# Patient Record
Sex: Female | Born: 1980 | Race: Black or African American | Hispanic: No | Marital: Married | Smoking: Current some day smoker
Health system: Southern US, Community
[De-identification: ages and names within clinical notes are randomized; demographics above are authoritative.]

## PROBLEM LIST (undated history)

## (undated) DIAGNOSIS — N63 Unspecified lump in unspecified breast: Secondary | ICD-10-CM

## (undated) HISTORY — DX: Unspecified lump in unspecified breast: N63.0

---

## 2001-11-19 ENCOUNTER — Emergency Department (HOSPITAL_COMMUNITY): Admission: EM | Admit: 2001-11-19 | Discharge: 2001-11-19 | Payer: Self-pay | Admitting: *Deleted

## 2001-11-19 ENCOUNTER — Encounter: Payer: Self-pay | Admitting: *Deleted

## 2004-07-02 ENCOUNTER — Other Ambulatory Visit: Admission: RE | Admit: 2004-07-02 | Discharge: 2004-07-02 | Payer: Self-pay | Admitting: Gynecology

## 2005-11-29 ENCOUNTER — Other Ambulatory Visit: Admission: RE | Admit: 2005-11-29 | Discharge: 2005-11-29 | Payer: Self-pay | Admitting: Gynecology

## 2007-03-28 ENCOUNTER — Other Ambulatory Visit: Admission: RE | Admit: 2007-03-28 | Discharge: 2007-03-28 | Payer: Self-pay | Admitting: Gynecology

## 2007-06-10 ENCOUNTER — Emergency Department (HOSPITAL_COMMUNITY): Admission: EM | Admit: 2007-06-10 | Discharge: 2007-06-10 | Payer: Self-pay | Admitting: Emergency Medicine

## 2009-02-04 ENCOUNTER — Other Ambulatory Visit: Admission: RE | Admit: 2009-02-04 | Discharge: 2009-02-04 | Payer: Self-pay | Admitting: Gynecology

## 2009-02-04 ENCOUNTER — Ambulatory Visit: Payer: Self-pay | Admitting: Women's Health

## 2009-10-16 ENCOUNTER — Ambulatory Visit: Payer: Self-pay | Admitting: Women's Health

## 2010-03-22 ENCOUNTER — Other Ambulatory Visit (HOSPITAL_COMMUNITY)
Admission: RE | Admit: 2010-03-22 | Discharge: 2010-03-22 | Disposition: A | Discharge status: Home / Self Care | Payer: Managed Care, Other (non HMO) | Source: Ambulatory Visit | Attending: Gynecology | Admitting: Gynecology

## 2010-03-22 ENCOUNTER — Other Ambulatory Visit: Payer: Self-pay | Admitting: Women's Health

## 2010-03-22 ENCOUNTER — Encounter (INDEPENDENT_AMBULATORY_CARE_PROVIDER_SITE_OTHER): Payer: Managed Care, Other (non HMO) | Admitting: Women's Health

## 2010-03-22 DIAGNOSIS — Z01419 Encounter for gynecological examination (general) (routine) without abnormal findings: Secondary | ICD-10-CM

## 2010-03-22 DIAGNOSIS — Z124 Encounter for screening for malignant neoplasm of cervix: Secondary | ICD-10-CM | POA: Insufficient documentation

## 2010-03-22 DIAGNOSIS — Z13220 Encounter for screening for lipoid disorders: Secondary | ICD-10-CM

## 2010-03-22 DIAGNOSIS — R809 Proteinuria, unspecified: Secondary | ICD-10-CM

## 2010-05-19 ENCOUNTER — Inpatient Hospital Stay (INDEPENDENT_AMBULATORY_CARE_PROVIDER_SITE_OTHER)
Admission: RE | Admit: 2010-05-19 | Discharge: 2010-05-19 | Disposition: A | Discharge status: Home / Self Care | Payer: Managed Care, Other (non HMO) | Source: Ambulatory Visit | Attending: Family Medicine | Admitting: Family Medicine

## 2010-05-19 DIAGNOSIS — K089 Disorder of teeth and supporting structures, unspecified: Secondary | ICD-10-CM

## 2010-07-17 ENCOUNTER — Inpatient Hospital Stay (INDEPENDENT_AMBULATORY_CARE_PROVIDER_SITE_OTHER)
Admission: RE | Admit: 2010-07-17 | Discharge: 2010-07-17 | Disposition: A | Discharge status: Home / Self Care | Payer: Managed Care, Other (non HMO) | Source: Ambulatory Visit | Attending: Family Medicine | Admitting: Family Medicine

## 2010-07-17 DIAGNOSIS — R300 Dysuria: Secondary | ICD-10-CM

## 2010-07-17 DIAGNOSIS — N760 Acute vaginitis: Secondary | ICD-10-CM

## 2010-07-17 LAB — POCT URINALYSIS DIP (DEVICE)
Bilirubin Urine: NEGATIVE
Glucose, UA: NEGATIVE mg/dL
Ketones, ur: NEGATIVE mg/dL
Leukocytes, UA: NEGATIVE
Nitrite: POSITIVE — AB
Protein, ur: 30 mg/dL — AB
Specific Gravity, Urine: 1.02 (ref 1.005–1.030)
Urobilinogen, UA: 1 mg/dL (ref 0.0–1.0)
pH: 7 (ref 5.0–8.0)

## 2010-07-17 LAB — WET PREP, GENITAL
Clue Cells Wet Prep HPF POC: NONE SEEN
Yeast Wet Prep HPF POC: NONE SEEN

## 2010-07-17 LAB — POCT PREGNANCY, URINE: Preg Test, Ur: NEGATIVE

## 2010-07-19 LAB — URINE CULTURE
Colony Count: >=100000
Culture  Setup Time: 201207010132

## 2010-07-19 LAB — GC/CHLAMYDIA PROBE AMP, GENITAL: GC Probe Amp, Genital: NEGATIVE

## 2010-09-17 ENCOUNTER — Encounter: Payer: Self-pay | Admitting: Gynecology

## 2010-09-17 ENCOUNTER — Ambulatory Visit (INDEPENDENT_AMBULATORY_CARE_PROVIDER_SITE_OTHER): Payer: Managed Care, Other (non HMO) | Admitting: Gynecology

## 2010-09-17 DIAGNOSIS — B3731 Acute candidiasis of vulva and vagina: Secondary | ICD-10-CM

## 2010-09-17 DIAGNOSIS — N926 Irregular menstruation, unspecified: Secondary | ICD-10-CM

## 2010-09-17 DIAGNOSIS — B373 Candidiasis of vulva and vagina: Secondary | ICD-10-CM

## 2010-09-17 DIAGNOSIS — N898 Other specified noninflammatory disorders of vagina: Secondary | ICD-10-CM

## 2010-09-17 DIAGNOSIS — N63 Unspecified lump in unspecified breast: Secondary | ICD-10-CM

## 2010-09-17 MED ORDER — FLUCONAZOLE 150 MG PO TABS: 150.0000 mg | ORAL_TABLET | Freq: Once | ORAL | Status: AC

## 2010-09-17 NOTE — Patient Instructions (Signed)
Follow up for breast sono and mammogram that doctors office will schedule

## 2010-09-17 NOTE — Progress Notes (Signed)
Patient presents for several issues. She had been on oral contraceptives but stopped them notes her last period August 16 was a little lighter and she asked if she could check a pregnancy test. She did not restart birth control pills. Also notes 2 weeks worth of vaginal discharge no itching but just heavier discharge.  Lastly patient notes a nodule that she fell 2 weeks ago in her left breast has not reexamined herself notes it was a little bit tender.  Exam Breasts: Examined lying and sitting. Left without masses retractions discharge adenopathy. The area she's pointing to at 6:00 several fingerbreadths below the areola has no palpable abnormalities. Right with firm oval mass approximately 2 cm 9:00 at the areola. Freely mobile no overlying skin changes nipple discharge or axillary adenopathy. Pelvic: External BUS vagina with white discharge KOH wet prep done, cervix normal, uterus normal size midline mobile nontender, adnexa without masses or tenderness  Assessment and plan: #1 White discharge. KOH wet prep positive for yeast we'll treat with Diflucan 150x1 dose follow up if symptoms persist or recur. #2 Lighter last menstrual period. We'll check UPT at her request. Assuming negative we'll keep menstrual calendar as long as regular will follow if irregular will represent for evaluation and again the patient does not want contraception at this time. #3 Breast lump. Patient presents with questionable left breast lump and in fact her left breast is without any abnormalities. On her right breast she has a firm nodule at the 9 clock position off the areola. I attempted aspiration today alcohol cleanse 1% lidocaine infiltration with no return. The mass felt very firm and gritty to the needle. 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 I discussed all this with the patient the need for further evaluation and we will schedule diagnostic mammo-bilateral and ultrasound of the right breast. Various scenarios to include  observation, needle core biopsy, excisional biopsy were all reviewed she knows importance of followup and we'll further discuss once the mammogram and ultrasound reports are available. She knows if she does not hear from my office to schedule the studies in several days to call us.

## 2010-09-23 ENCOUNTER — Ambulatory Visit: Payer: Managed Care, Other (non HMO) | Admitting: Gynecology

## 2010-09-23 ENCOUNTER — Ambulatory Visit
Admission: RE | Admit: 2010-09-23 | Discharge: 2010-09-23 | Disposition: A | Discharge status: Home / Self Care | Payer: Managed Care, Other (non HMO) | Source: Ambulatory Visit | Attending: Gynecology | Admitting: Gynecology

## 2010-09-23 ENCOUNTER — Ambulatory Visit: Payer: Managed Care, Other (non HMO) | Admitting: Women's Health

## 2010-09-23 ENCOUNTER — Telehealth: Payer: Self-pay | Admitting: Gynecology

## 2010-09-23 DIAGNOSIS — N63 Unspecified lump in unspecified breast: Secondary | ICD-10-CM

## 2010-09-23 NOTE — Telephone Encounter (Signed)
Tehl patient I received mammogram and ultrasound report that were normal other than showing some cysts. I want to reexamine her breast and ask her to make an appointment to see me.

## 2010-09-23 NOTE — Telephone Encounter (Signed)
Pt informed with the below note, pt transferred to appointment desk to schedule.

## 2010-10-18 ENCOUNTER — Ambulatory Visit (INDEPENDENT_AMBULATORY_CARE_PROVIDER_SITE_OTHER): Payer: Managed Care, Other (non HMO) | Admitting: Gynecology

## 2010-10-18 ENCOUNTER — Encounter: Payer: Self-pay | Admitting: Gynecology

## 2010-10-18 DIAGNOSIS — N63 Unspecified lump in unspecified breast: Secondary | ICD-10-CM

## 2010-10-18 DIAGNOSIS — N898 Other specified noninflammatory disorders of vagina: Secondary | ICD-10-CM

## 2010-10-18 DIAGNOSIS — B373 Candidiasis of vulva and vagina: Secondary | ICD-10-CM

## 2010-10-18 MED ORDER — TERCONAZOLE 0.8 % VA CREA: 1.0000 | TOPICAL_CREAM | Freq: Every day | VAGINAL | Status: AC

## 2010-10-18 NOTE — Patient Instructions (Signed)
Follow up with general surgeon to examine breast

## 2010-10-18 NOTE — Progress Notes (Signed)
Patient presents for 2 issues. First is followup of her right breast mass and the second is persistence of her vaginal discharge and itching. She was treated with Diflucan for yeast and her symptoms have persisted. She had ultrasound mammogram and reexam by the radiologist. They did not see any mass on mammogram or ultrasound in the right breast area. On exam the radiologist, Dr. Chilton Si noted no palpable abnormalities.  Exam Breast: Examined lying and sitting. Left without masses retractions discharge adenopathy. Right was clearly palpable 2 cm firm nodule retroareolar at 9:00 mobile no overlying skin changes nipple discharge or axillary adenopathy Pelvic: External BUS vagina with thick white discharge KOH wet prep done  Assessment and plan: 1. Vaginal discharge. KOH wet prep positive for yeast we'll treat with Terazol 3 day cream follow up if symptoms persist or recur. 2. Breast mass. Ultrasound mammogram negative but I clearly still feel an area.   I made the patient examine herself also she felt the abnormality. Refer to general surgery, I suspect this is either a fibroadenoma or possibly just firm breast tissue but I want them involved. I discussed with her the various options to include observation, needle core biopsy, excisional biopsy. Patient knows the importance of followup and she knows if she does not hear from my office in several days to arrange the appointment and she will call us to make sure that it is done.

## 2010-11-01 ENCOUNTER — Ambulatory Visit (INDEPENDENT_AMBULATORY_CARE_PROVIDER_SITE_OTHER): Payer: Self-pay | Admitting: Surgery

## 2010-11-22 ENCOUNTER — Ambulatory Visit (INDEPENDENT_AMBULATORY_CARE_PROVIDER_SITE_OTHER): Payer: Managed Care, Other (non HMO) | Admitting: Surgery

## 2010-11-22 ENCOUNTER — Encounter (INDEPENDENT_AMBULATORY_CARE_PROVIDER_SITE_OTHER): Payer: Self-pay | Admitting: Surgery

## 2010-11-22 ENCOUNTER — Other Ambulatory Visit (INDEPENDENT_AMBULATORY_CARE_PROVIDER_SITE_OTHER): Payer: Self-pay | Admitting: Surgery

## 2010-11-22 VITALS — BP 126/96 | HR 60 | Temp 97.4°F | Resp 20 | Ht 66.0 in | Wt 139.2 lb

## 2010-11-22 DIAGNOSIS — N63 Unspecified lump in unspecified breast: Secondary | ICD-10-CM

## 2010-11-22 DIAGNOSIS — N6310 Unspecified lump in the right breast, unspecified quadrant: Secondary | ICD-10-CM | POA: Insufficient documentation

## 2010-11-22 NOTE — Progress Notes (Signed)
Chief Complaint  Patient presents with  . New Evaluation    eval of lump in right breast     HPI Julie Snyder is a 30 y.o. female.   HPIThis is a pleasant female referred by Dr. Audie Box for evaluation of a right breast mass. Dr. Audie Box felt a mass back in August. It persisted when he saw her back in October. She now can feel the mass. She denies any drainage from her nipples. She has no previous history of breast biopsy were breast mass. She denies breast pain.  Past Medical History  Diagnosis Date  . Breast lump october 2012    Past Surgical History  Procedure Date  . Mouth surgery 5/12    Family History  Problem Relation Age of Onset  . Hypertension Mother     Social History History  Substance Use Topics  . Smoking status: Never Smoker   . Smokeless tobacco: Never Used  . Alcohol Use: Yes    No Known Allergies  No current outpatient prescriptions on file.    Review of Systems Review of Systems  Constitutional: Negative for fever, chills and unexpected weight change.  HENT: Negative for hearing loss, congestion, sore throat, trouble swallowing and voice change.   Eyes: Negative for visual disturbance.  Respiratory: Negative for cough and wheezing.   Cardiovascular: Negative for chest pain, palpitations and leg swelling.  Gastrointestinal: Negative for nausea, vomiting, abdominal pain, diarrhea, constipation, blood in stool, abdominal distention and anal bleeding.  Genitourinary: Negative for hematuria, vaginal bleeding and difficulty urinating.  Musculoskeletal: Negative for arthralgias.  Skin: Negative for rash and wound.  Neurological: Negative for seizures, syncope and headaches.  Hematological: Negative for adenopathy. Does not bruise/bleed easily.  Psychiatric/Behavioral: Negative for confusion.    Blood pressure 126/96, pulse 60, temperature 97.4 F (36.3 C), temperature source Temporal, resp. rate 20, height 5\' 6"  (1.676 m), weight 139 lb 4 oz  (63.163 kg).  Physical Exam Physical Exam  Constitutional: She is oriented to person, place, and time. She appears well-developed and well-nourished. No distress.  HENT:  Head: Normocephalic and atraumatic.  Right Ear: External ear normal.  Left Ear: External ear normal.  Nose: Nose normal.  Mouth/Throat: Oropharynx is clear and moist. No oropharyngeal exudate.  Eyes: Conjunctivae and EOM are normal. Pupils are equal, round, and reactive to light. Right eye exhibits no discharge. Left eye exhibits no discharge. No scleral icterus.  Neck: Normal range of motion. Neck supple. No JVD present. No tracheal deviation present. No thyromegaly present.  Cardiovascular: Normal rate, regular rhythm, normal heart sounds and intact distal pulses.   No murmur heard. Pulmonary/Chest: Effort normal and breath sounds normal. No respiratory distress. She has no wheezes. She has no rales.  Abdominal: Soft. Bowel sounds are normal. She exhibits no distension. There is no tenderness. There is no rebound.  Musculoskeletal: Normal range of motion. She exhibits no edema and no tenderness.  Lymphadenopathy:    She has no cervical adenopathy.  Neurological: She is alert and oriented to person, place, and time.  Skin: Skin is warm and dry. No rash noted. No erythema.  Psychiatric: Her behavior is normal. Judgment normal.   Breasts are examined bilaterally. There is a smooth deep one and a half to 2 cm subareolar right breast mass that is almost at the 9:00 position. Both breasts are lumpy bumpy. There is no excellent adenopathy on either side. Data Reviewed  I have reviewed the mammogram and ultrasound. No abnormalities are identified on either study  Assessment    Right breast mass    Plan    We discussed options which include expected management versus surgical excision of this area. She wishes to proceed with surgical excision for histologic evaluation to rule out malignancy. I've discussed this with her  in detail including the risks of bleeding, infection, injury to surrounding structures, findings of malignancy with need for further surgery, etc. Likelihood of success is good. Surgery we scheduled.       Demetres Prochnow A 11/22/2010, 10:30 AM

## 2010-12-18 HISTORY — PX: BREAST SURGERY: SHX581

## 2010-12-20 ENCOUNTER — Encounter: Payer: Self-pay | Admitting: Women's Health

## 2010-12-20 ENCOUNTER — Ambulatory Visit (INDEPENDENT_AMBULATORY_CARE_PROVIDER_SITE_OTHER): Payer: Managed Care, Other (non HMO) | Admitting: Women's Health

## 2010-12-20 DIAGNOSIS — N898 Other specified noninflammatory disorders of vagina: Secondary | ICD-10-CM

## 2010-12-20 DIAGNOSIS — N912 Amenorrhea, unspecified: Secondary | ICD-10-CM

## 2010-12-20 MED ORDER — FLUCONAZOLE 100 MG PO TABS: 100.0000 mg | ORAL_TABLET | Freq: Every day | ORAL | Status: DC

## 2010-12-20 NOTE — Progress Notes (Signed)
Patient ID: Julie Snyder, female   DOB: 1980/11/08, 30 y.o.   MRN: [id number] Presents with a complaint of discharge not resolved by over-the-counter Monistat. Was treated for trichomoniasis at an urgent care over the summer, negative STD screen. One partner since, did not use condoms and currently not active for several months. Denies any UTI symptoms, fever, or abdominal pain. Had stopped Loestrin 1/20 since not sexually active. LMP  10-31-10. UPT negative.  Exam: Abdomen soft nontender, external genitalia is within normal limits, speculum exam scant white discharge vaginal walls are slightly erythematous. Bimanual no CMT or adnexal fullness or tenderness. GC/Chlamydia culture taken and is pending.  Wet prep positive for yeast  Plan: Diflucan 100 by mouth daily for 5 days, then weekly for 3 weeks then monthly with cycle. Start back on Loestrin 1/20 1st day of next cycle. Instructed to call or return to office if no cycle by end of December. Did review  importance of condoms. Will check HIV, hepatitis and RPR at annual exam.

## 2010-12-29 ENCOUNTER — Encounter (HOSPITAL_COMMUNITY): Payer: Self-pay | Admitting: Pharmacy Technician

## 2010-12-31 ENCOUNTER — Encounter (HOSPITAL_COMMUNITY): Payer: Self-pay

## 2010-12-31 ENCOUNTER — Encounter (HOSPITAL_COMMUNITY)
Admission: RE | Admit: 2010-12-31 | Discharge: 2010-12-31 | Disposition: A | Discharge status: Home / Self Care | Payer: Managed Care, Other (non HMO) | Source: Ambulatory Visit | Attending: Surgery | Admitting: Surgery

## 2010-12-31 HISTORY — PX: MOUTH SURGERY: SHX715

## 2010-12-31 LAB — HCG, SERUM, QUALITATIVE: Preg, Serum: NEGATIVE

## 2010-12-31 NOTE — Patient Instructions (Signed)
[address] Ala Kratz  12/31/2010   Your procedure is scheduled on: 01-05-11  Report to [facility] at 0745 AM.  Call this number if you have problems the morning of surgery: [phone]   Remember:   Do not eat food:After Midnight.  May have clear liquids:until Midnight .  Clear liquids include soda, tea, black coffee, apple or grape juice, broth.  Take these medicines the morning of surgery with A SIP OF WATER:none   Do not wear jewelry, make-up or nail polish.  Do not wear lotions, powders, or perfumes. You may wear deodorant.  Do not shave 48 hours prior to surgery.  Do not bring valuables to the hospital.  Contacts, dentures or bridgework may not be worn into surgery.  Leave suitcase in the car. After surgery it may be brought to your room.  For patients admitted to the hospital, checkout time is 11:00 AM the day of discharge.   Patients discharged the day of surgery will not be allowed to drive home.  Name and phone number of your driver: Serena Croissant  Special Instructions: CHG Shower Use Special Wash: 1/2 bottle night before surgery and 1/2 bottle morning of surgery.             Do not shave body hair x2 days prior   Please read over the following fact sheets that you were given: MRSA Information

## 2011-01-05 ENCOUNTER — Ambulatory Visit (HOSPITAL_COMMUNITY)
Admission: RE | Admit: 2011-01-05 | Discharge: 2011-01-05 | Disposition: A | Discharge status: Home / Self Care | Payer: Managed Care, Other (non HMO) | Source: Ambulatory Visit | Attending: Surgery | Admitting: Surgery

## 2011-01-05 ENCOUNTER — Encounter (HOSPITAL_COMMUNITY): Payer: Self-pay | Admitting: Anesthesiology

## 2011-01-05 ENCOUNTER — Ambulatory Visit (HOSPITAL_COMMUNITY): Payer: Managed Care, Other (non HMO) | Admitting: Anesthesiology

## 2011-01-05 ENCOUNTER — Encounter (HOSPITAL_COMMUNITY): Payer: Self-pay

## 2011-01-05 ENCOUNTER — Other Ambulatory Visit (INDEPENDENT_AMBULATORY_CARE_PROVIDER_SITE_OTHER): Payer: Self-pay | Admitting: Surgery

## 2011-01-05 ENCOUNTER — Encounter (HOSPITAL_COMMUNITY)
Admission: RE | Disposition: A | Discharge status: Home / Self Care | Payer: Self-pay | Source: Ambulatory Visit | Attending: Surgery

## 2011-01-05 DIAGNOSIS — Z01812 Encounter for preprocedural laboratory examination: Secondary | ICD-10-CM | POA: Insufficient documentation

## 2011-01-05 DIAGNOSIS — N6019 Diffuse cystic mastopathy of unspecified breast: Secondary | ICD-10-CM

## 2011-01-05 DIAGNOSIS — N6039 Fibrosclerosis of unspecified breast: Secondary | ICD-10-CM | POA: Insufficient documentation

## 2011-01-05 HISTORY — PX: BREAST LUMPECTOMY: SHX2

## 2011-01-05 SURGERY — BREAST LUMPECTOMY
Anesthesia: General | Site: Breast | Laterality: Right | Wound class: Clean

## 2011-01-05 MED ORDER — SODIUM CHLORIDE 0.9 % IJ SOLN: 3.0000 mL | INTRAMUSCULAR | Status: DC | PRN

## 2011-01-05 MED ORDER — ACETAMINOPHEN 10 MG/ML IV SOLN
INTRAVENOUS | Status: AC
  Filled 2011-01-05: qty 100

## 2011-01-05 MED ORDER — CEFAZOLIN SODIUM 1-5 GM-% IV SOLN
INTRAVENOUS | Status: DC | PRN
  Administered 2011-01-05: 1 g via INTRAVENOUS

## 2011-01-05 MED ORDER — ONDANSETRON HCL 4 MG/2ML IJ SOLN: 4.0000 mg | Freq: Four times a day (QID) | INTRAMUSCULAR | Status: DC | PRN

## 2011-01-05 MED ORDER — ACETAMINOPHEN 10 MG/ML IV SOLN
INTRAVENOUS | Status: DC | PRN
  Administered 2011-01-05: 1000 mg via INTRAVENOUS

## 2011-01-05 MED ORDER — BUPIVACAINE HCL (PF) 0.5 % IJ SOLN
INTRAMUSCULAR | Status: AC
  Filled 2011-01-05: qty 30

## 2011-01-05 MED ORDER — PROMETHAZINE HCL 25 MG/ML IJ SOLN: 6.2500 mg | INTRAMUSCULAR | Status: DC | PRN

## 2011-01-05 MED ORDER — CEFAZOLIN SODIUM 1-5 GM-% IV SOLN: 1.0000 g | INTRAVENOUS | Status: DC

## 2011-01-05 MED ORDER — SODIUM CHLORIDE 0.9 % IV SOLN: 250.0000 mL | INTRAVENOUS | Status: DC | PRN

## 2011-01-05 MED ORDER — CEFAZOLIN SODIUM 1-5 GM-% IV SOLN
INTRAVENOUS | Status: AC
  Filled 2011-01-05: qty 50

## 2011-01-05 MED ORDER — SODIUM CHLORIDE 0.9 % IJ SOLN: 3.0000 mL | Freq: Two times a day (BID) | INTRAMUSCULAR | Status: DC

## 2011-01-05 MED ORDER — FENTANYL CITRATE 0.05 MG/ML IJ SOLN: 25.0000 ug | INTRAMUSCULAR | Status: DC | PRN

## 2011-01-05 MED ORDER — PROPOFOL 10 MG/ML IV BOLUS
INTRAVENOUS | Status: DC | PRN
  Administered 2011-01-05: 180 mg via INTRAVENOUS

## 2011-01-05 MED ORDER — OXYCODONE HCL 5 MG PO TABS: 5.0000 mg | ORAL_TABLET | ORAL | Status: DC | PRN

## 2011-01-05 MED ORDER — MORPHINE SULFATE 10 MG/ML IJ SOLN: 2.0000 mg | INTRAMUSCULAR | Status: DC | PRN

## 2011-01-05 MED ORDER — PROMETHAZINE HCL 25 MG/ML IJ SOLN: 12.5000 mg | Freq: Four times a day (QID) | INTRAMUSCULAR | Status: DC | PRN

## 2011-01-05 MED ORDER — ACETAMINOPHEN 650 MG RE SUPP
650.0000 mg | RECTAL | Status: DC | PRN
  Filled 2011-01-05: qty 1

## 2011-01-05 MED ORDER — LACTATED RINGERS IV SOLN
INTRAVENOUS | Status: DC
  Administered 2011-01-05: 1000 mL via INTRAVENOUS

## 2011-01-05 MED ORDER — HYDROCODONE-ACETAMINOPHEN 5-325 MG PO TABS: 1.0000 | ORAL_TABLET | ORAL | Status: AC | PRN

## 2011-01-05 MED ORDER — ACETAMINOPHEN 325 MG PO TABS: 650.0000 mg | ORAL_TABLET | ORAL | Status: DC | PRN

## 2011-01-05 MED ORDER — FENTANYL CITRATE 0.05 MG/ML IJ SOLN
INTRAMUSCULAR | Status: DC | PRN
  Administered 2011-01-05 (×2): 50 ug via INTRAVENOUS

## 2011-01-05 MED ORDER — LIDOCAINE-EPINEPHRINE (PF) 1 %-1:200000 IJ SOLN
INTRAMUSCULAR | Status: AC
  Filled 2011-01-05: qty 10

## 2011-01-05 MED ORDER — 0.9 % SODIUM CHLORIDE (POUR BTL) OPTIME
TOPICAL | Status: DC | PRN
  Administered 2011-01-05: 1000 mL

## 2011-01-05 MED ORDER — MIDAZOLAM HCL 5 MG/5ML IJ SOLN
INTRAMUSCULAR | Status: DC | PRN
  Administered 2011-01-05: 2 mg via INTRAVENOUS

## 2011-01-05 MED ORDER — LIDOCAINE HCL (CARDIAC) 20 MG/ML IV SOLN
INTRAVENOUS | Status: DC | PRN
  Administered 2011-01-05: 80 mg via INTRAVENOUS

## 2011-01-05 MED ORDER — BUPIVACAINE HCL (PF) 0.5 % IJ SOLN
INTRAMUSCULAR | Status: DC | PRN
  Administered 2011-01-05: 18 mL

## 2011-01-05 MED ORDER — ONDANSETRON HCL 4 MG/2ML IJ SOLN
INTRAMUSCULAR | Status: DC | PRN
  Administered 2011-01-05: 4 mg via INTRAVENOUS

## 2011-01-05 SURGICAL SUPPLY — 35 items
ADH SKN CLS APL DERMABOND .7 (GAUZE/BANDAGES/DRESSINGS) ×1
APL SKNCLS STERI-STRIP NONHPOA (GAUZE/BANDAGES/DRESSINGS) ×1
APPLIER CLIP 9.375 MED OPEN (MISCELLANEOUS)
APR CLP MED 9.3 20 MLT OPN (MISCELLANEOUS)
BENZOIN TINCTURE PRP APPL 2/3 (GAUZE/BANDAGES/DRESSINGS) ×1 IMPLANT
CANISTER SUCTION 2500CC (MISCELLANEOUS) ×2 IMPLANT
CHLORAPREP W/TINT 26ML (MISCELLANEOUS) ×2 IMPLANT
CLIP APPLIE 9.375 MED OPEN (MISCELLANEOUS) IMPLANT
CLOSURE STERI STRIP 1/2 X4 (GAUZE/BANDAGES/DRESSINGS) ×1 IMPLANT
CLOTH BEACON ORANGE TIMEOUT ST (SAFETY) ×2 IMPLANT
COVER PROBE W GEL 5X96 (DRAPES) IMPLANT
COVER SURGICAL LIGHT HANDLE (MISCELLANEOUS) ×2 IMPLANT
DECANTER SPIKE VIAL GLASS SM (MISCELLANEOUS) ×2 IMPLANT
DERMABOND ADVANCED (GAUZE/BANDAGES/DRESSINGS) ×1
DERMABOND ADVANCED .7 DNX12 (GAUZE/BANDAGES/DRESSINGS) ×1 IMPLANT
ELECT CAUTERY BLADE 6.4 (BLADE) ×2 IMPLANT
ELECT REM PT RETURN 9FT ADLT (ELECTROSURGICAL) ×2
ELECTRODE REM PT RTRN 9FT ADLT (ELECTROSURGICAL) ×1 IMPLANT
GLOVE SURG SIGNA 7.5 PF LTX (GLOVE) ×2 IMPLANT
GOWN PREVENTION PLUS XLARGE (GOWN DISPOSABLE) ×2 IMPLANT
GOWN STRL NON-REIN LRG LVL3 (GOWN DISPOSABLE) ×2 IMPLANT
KIT BASIN OR (CUSTOM PROCEDURE TRAY) ×2 IMPLANT
KIT MARKER MARGIN INK (KITS) IMPLANT
NS IRRIG 1000ML POUR BTL (IV SOLUTION) ×2 IMPLANT
PACK GENERAL/GYN (CUSTOM PROCEDURE TRAY) ×2 IMPLANT
SPONGE GAUZE 4X4 12PLY (GAUZE/BANDAGES/DRESSINGS) ×1 IMPLANT
SPONGE LAP 4X18 X RAY DECT (DISPOSABLE) ×2 IMPLANT
STAPLER VISISTAT 35W (STAPLE) ×2 IMPLANT
SUT MNCRL AB 4-0 PS2 18 (SUTURE) ×2 IMPLANT
SUT SILK 2 0 SH (SUTURE) IMPLANT
SUT VIC AB 3-0 SH 18 (SUTURE) ×2 IMPLANT
SYR CONTROL 10ML LL (SYRINGE) ×2 IMPLANT
TAPE HYPAFIX 4 X10 (GAUZE/BANDAGES/DRESSINGS) ×1 IMPLANT
TOWEL OR 17X26 10 PK STRL BLUE (TOWEL DISPOSABLE) ×2 IMPLANT
WATER STERILE IRR 1000ML POUR (IV SOLUTION) IMPLANT

## 2011-01-05 NOTE — Anesthesia Preprocedure Evaluation (Signed)
Anesthesia Evaluation  Patient identified by MRN, date of birth, ID band Patient awake    Reviewed: Allergy & Precautions, H&P , NPO status , Patient's Chart, lab work & pertinent test results  Airway Mallampati: II TM Distance: >3 FB Neck ROM: Full    Dental No notable dental hx.    Pulmonary neg pulmonary ROS,  clear to auscultation  Pulmonary exam normal       Cardiovascular neg cardio ROS Regular Normal    Neuro/Psych Negative Neurological ROS  Negative Psych ROS   GI/Hepatic negative GI ROS, Neg liver ROS,   Endo/Other  Negative Endocrine ROS  Renal/GU negative Renal ROS  Genitourinary negative   Musculoskeletal negative musculoskeletal ROS (+)   Abdominal   Peds negative pediatric ROS (+)  Hematology negative hematology ROS (+)   Anesthesia Other Findings   Reproductive/Obstetrics negative OB ROS                           Anesthesia Physical Anesthesia Plan  ASA: II  Anesthesia Plan: General   Post-op Pain Management:    Induction: Intravenous  Airway Management Planned: LMA  Additional Equipment:   Intra-op Plan:   Post-operative Plan: Extubation in OR  Informed Consent: I have reviewed the patients History and Physical, chart, labs and discussed the procedure including the risks, benefits and alternatives for the proposed anesthesia with the patient or authorized representative who has indicated his/her understanding and acceptance.   Dental advisory given  Plan Discussed with: CRNA  Anesthesia Plan Comments:        Anesthesia Quick Evaluation  

## 2011-01-05 NOTE — Transfer of Care (Signed)
Immediate Anesthesia Transfer of Care Note  Patient: Julie Snyder  Procedure(s) Performed:  LUMPECTOMY  Patient Location: PACU  Anesthesia Type: General  Level of Consciousness: awake  Airway & Oxygen Therapy: Patient Spontanous Breathing and Patient connected to face mask oxygen  Post-op Assessment: Report given to PACU RN and Post -op Vital signs reviewed and stable  Post vital signs: Reviewed and stable  Complications: No apparent anesthesia complications

## 2011-01-05 NOTE — Interval H&P Note (Signed)
History and Physical Interval Note:  Since I saw her last, she has had no change in her history or exam of the right breast mass  01/05/2011 6:28 AM  Wyn Quaker  has presented today for surgery, with the diagnosis of Right breast mass  The various methods of treatment have been discussed with the patient and family. After consideration of risks, benefits and other options for treatment, the patient has consented to  Procedure(s): LUMPECTOMY as a surgical intervention .  The patients' history has been reviewed, patient examined, no change in status, stable for surgery.  I have reviewed the patients' chart and labs.  Questions were answered to the patient's satisfaction.     Brittnee Gaetano A

## 2011-01-05 NOTE — Anesthesia Postprocedure Evaluation (Signed)
  Anesthesia Post-op Note  Patient: Julie Snyder  Procedure(s) Performed:  LUMPECTOMY  Patient Location: PACU  Anesthesia Type: General  Level of Consciousness: awake and alert   Airway and Oxygen Therapy: Patient Spontanous Breathing  Post-op Pain: mild  Post-op Assessment: Post-op Vital signs reviewed, Patient's Cardiovascular Status Stable, Respiratory Function Stable, Patent Airway and No signs of Nausea or vomiting  Post-op Vital Signs: stable  Complications: No apparent anesthesia complications

## 2011-01-05 NOTE — Op Note (Signed)
01/05/2011  10:09 AM  PATIENT:  Julie Snyder  30 y.o. female  PRE-OPERATIVE DIAGNOSIS:  Right breast mass  POST-OPERATIVE DIAGNOSIS:  right breast mass'   PROCEDURE:  Procedure(s): Excision of Right Breast Mass  SURGEON:  Surgeon(s): Shelly Rubenstein, MD  PHYSICIAN ASSISTANT:   ASSISTANTS: none   ANESTHESIA:   local and general  EBL:     BLOOD ADMINISTERED:none  DRAINS: none   LOCAL MEDICATIONS USED:  MARCAINE 18CC  SPECIMEN:  Excision right breast mass  DISPOSITION OF SPECIMEN:  PATHOLOGY  COUNTS:  YES  TOURNIQUET:  * No tourniquets in log *  DICTATION: .Dragon Dictation   PLAN OF CARE: Discharge to home after PACU  PATIENT DISPOSITION:  PACU - hemodynamically stable.   Indications: This is a young female with a palpable right breast mass. The decision had been made to proceed with removal of the mass.  Findings: The mass appeared consistent with a cyst. It was sent to pathology for evaluation  Procedure: The patient was brought to the operating room and identified as the correct patient. She was placed supine on the operating room table and general anesthesia was induced. Her right breast was prepped and draped in the usual sterile fashion. I anesthetized the skin over the palpable mass at the lateral edge of the areola with Marcaine. I then made a circumareolar incision with a scalpel. I took this down to the breast mass with electric cautery. As the mass is identified it leaked fluid consistent with a benign cyst. I excised the mass in its entirety with electrocautery and sent the mass to pathology for evaluation. I then irrigated with saline. Hemostasis was achieved with cautery. I palpated no further masses. I then closed the subcutaneous tissue with interrupted 3-0 Vicryl sutures and closed the skin with a running 4-0 Monocryl. Steri-Strips and bandage were applied. The patient tolerated the procedure well. All the counts were correct at the end of the  procedure. The patient was then extubated in the operating room and taken in stable condition to the recovery room.

## 2011-01-05 NOTE — H&P (Signed)
HPI  Julie Snyder is a 30 y.o. female.  HPIThis is a pleasant female referred by Dr. Audie Box for evaluation of a right breast mass. Dr. Audie Box felt a mass back in August. It persisted when he saw her back in October. She now can feel the mass. She denies any drainage from her nipples. She has no previous history of breast biopsy were breast mass. She denies breast pain.  Past Medical History   Diagnosis  Date   .  Breast lump  october 2012    Past Surgical History   Procedure  Date   .  Mouth surgery  5/12    Family History   Problem  Relation  Age of Onset   .  Hypertension  Mother     Social History  History   Substance Use Topics   .  Smoking status:  Never Smoker   .  Smokeless tobacco:  Never Used   .  Alcohol Use:  Yes    No Known Allergies  No current outpatient prescriptions on file.    Review of Systems  Review of Systems  Constitutional: Negative for fever, chills and unexpected weight change.  HENT: Negative for hearing loss, congestion, sore throat, trouble swallowing and voice change.  Eyes: Negative for visual disturbance.  Respiratory: Negative for cough and wheezing.  Cardiovascular: Negative for chest pain, palpitations and leg swelling.  Gastrointestinal: Negative for nausea, vomiting, abdominal pain, diarrhea, constipation, blood in stool, abdominal distention and anal bleeding.  Genitourinary: Negative for hematuria, vaginal bleeding and difficulty urinating.  Musculoskeletal: Negative for arthralgias.  Skin: Negative for rash and wound.  Neurological: Negative for seizures, syncope and headaches.  Hematological: Negative for adenopathy. Does not bruise/bleed easily.  Psychiatric/Behavioral: Negative for confusion.   Blood pressure 126/96, pulse 60, temperature 97.4 F (36.3 C), temperature source Temporal, resp. rate 20, height 5\' 6"  (1.676 m), weight 139 lb 4 oz (63.163 kg).  Physical Exam  Physical Exam  Constitutional: She is oriented to  person, place, and time. She appears well-developed and well-nourished. No distress.  HENT:  Head: Normocephalic and atraumatic.  Right Ear: External ear normal.  Left Ear: External ear normal.  Nose: Nose normal.  Mouth/Throat: Oropharynx is clear and moist. No oropharyngeal exudate.  Eyes: Conjunctivae and EOM are normal. Pupils are equal, round, and reactive to light. Right eye exhibits no discharge. Left eye exhibits no discharge. No scleral icterus.  Neck: Normal range of motion. Neck supple. No JVD present. No tracheal deviation present. No thyromegaly present.  Cardiovascular: Normal rate, regular rhythm, normal heart sounds and intact distal pulses.  No murmur heard.  Pulmonary/Chest: Effort normal and breath sounds normal. No respiratory distress. She has no wheezes. She has no rales.  Abdominal: Soft. Bowel sounds are normal. She exhibits no distension. There is no tenderness. There is no rebound.  Musculoskeletal: Normal range of motion. She exhibits no edema and no tenderness.  Lymphadenopathy:  She has no cervical adenopathy.  Neurological: She is alert and oriented to person, place, and time.  Skin: Skin is warm and dry. No rash noted. No erythema.  Psychiatric: Her behavior is normal. Judgment normal.  Breasts are examined bilaterally. There is a smooth deep one and a half to 2 cm subareolar right breast mass that is almost at the 9:00 position. Both breasts are lumpy bumpy. There is no excellent adenopathy on either side.    Data Reviewed  I have reviewed the mammogram and ultrasound. No abnormalities are identified on  either study  Assessment   Right breast mass   Plan   We discussed options which include expected management versus surgical excision of this area. She wishes to proceed with surgical excision for histologic evaluation to rule out malignancy. I've discussed this with her in detail including the risks of bleeding, infection, injury to surrounding  structures, findings of malignancy with need for further surgery, etc. Likelihood of success is good. Surgery we scheduled.   Stephanos Fan A

## 2011-01-06 ENCOUNTER — Telehealth (INDEPENDENT_AMBULATORY_CARE_PROVIDER_SITE_OTHER): Payer: Self-pay | Admitting: General Surgery

## 2011-01-06 NOTE — Telephone Encounter (Signed)
Pt called today at 4:45pm stating that she passed out in the bathroom and hit her head to the toilet last night 01-05-2011. And she also stated that she was two hours away and her head hurt. I told the pt to go to the ER and get a CT of her head

## 2011-01-07 ENCOUNTER — Telehealth (INDEPENDENT_AMBULATORY_CARE_PROVIDER_SITE_OTHER): Payer: Self-pay

## 2011-01-07 ENCOUNTER — Encounter (HOSPITAL_COMMUNITY): Payer: Self-pay | Admitting: Surgery

## 2011-01-07 NOTE — Telephone Encounter (Signed)
Pt calling in after doing dressing changes on pt's breast bx b/c she noticed an opening at the incision. I advised pt that as long as there is no redness,drainage,fever,or chills going with the br bx than she should just keep doing the dressing changes as normal. Pt adv. To call if any changes./ [facility]

## 2011-01-10 ENCOUNTER — Encounter (INDEPENDENT_AMBULATORY_CARE_PROVIDER_SITE_OTHER): Payer: Self-pay | Admitting: Surgery

## 2011-01-10 ENCOUNTER — Ambulatory Visit (INDEPENDENT_AMBULATORY_CARE_PROVIDER_SITE_OTHER): Payer: Managed Care, Other (non HMO) | Admitting: Surgery

## 2011-01-10 VITALS — BP 126/88 | HR 92 | Temp 96.8°F | Resp 16 | Ht 66.0 in | Wt 142.8 lb

## 2011-01-10 DIAGNOSIS — Z09 Encounter for follow-up examination after completed treatment for conditions other than malignant neoplasm: Secondary | ICD-10-CM

## 2011-01-10 NOTE — Progress Notes (Signed)
Subjective:     Patient ID: Julie Snyder, female   DOB: September 15, 1980, 30 y.o.   MRN: [id number]  HPI  She is here for a postoperative visit. She is status post a lumpectomy from last week. She was having some breakdown of the incision Review of Systems     Objective:   Physical Exam On exam, a small open area has developed. I pulled this together with Steri-Strips. There was no evidence of infection.  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 She will leave the Steri-Strips will next week. I will see her back in 2 weeks

## 2011-01-17 ENCOUNTER — Telehealth: Payer: Self-pay | Admitting: *Deleted

## 2011-01-17 NOTE — Telephone Encounter (Signed)
Telephone call, states cycle started with light bleeding and had 4-5 heavier days, now it has been light since. 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 Did have a negative blood pregnancy test at the time of the surgery, has not been sexually active since August. Stopped birth control pills since not active. Had had irregular cycles in her early 26s that resolved with birth control use. Will watch bleeding at this time, if bleeding does not stop by the end of the week will call back. Did review starting birth control pills first day of her next cycle for cycle control.

## 2011-01-17 NOTE — Telephone Encounter (Signed)
Pt calling wanting to let you know that she has been bleeding now for 21 days. Her LMP before this one was 10/31/10. Pt has not yet started her Loestrin 1/20 pills as directed once period starts. Pt wants to know if this is normal for her? Please advise

## 2011-01-19 ENCOUNTER — Encounter (INDEPENDENT_AMBULATORY_CARE_PROVIDER_SITE_OTHER): Payer: Self-pay | Admitting: Surgery

## 2011-01-19 ENCOUNTER — Ambulatory Visit (INDEPENDENT_AMBULATORY_CARE_PROVIDER_SITE_OTHER): Payer: Managed Care, Other (non HMO) | Admitting: Surgery

## 2011-01-19 VITALS — BP 126/82 | HR 68 | Temp 98.3°F | Resp 18 | Ht 65.0 in | Wt 140.0 lb

## 2011-01-19 DIAGNOSIS — Z09 Encounter for follow-up examination after completed treatment for conditions other than malignant neoplasm: Secondary | ICD-10-CM

## 2011-01-19 NOTE — Progress Notes (Signed)
Subjective:     Patient ID: Julie Snyder, female   DOB: July 05, 1980, 31 y.o.   MRN: [id number]  HPI She reports the wound has opened up and she is having drainage and nipple sensitivity. She denies fevers  Review of Systems     Objective:   Physical Exam On exam, the wound has opened up and there is a draining slight hematoma    Assessment:     Patient status post excision of breast mass with postop wound seroma/hematoma and now a draining open wound    Plan:     I will start her on hydrogel wound gel daily. I will see her back next week

## 2011-01-25 ENCOUNTER — Encounter (INDEPENDENT_AMBULATORY_CARE_PROVIDER_SITE_OTHER): Payer: Self-pay | Admitting: Surgery

## 2011-01-25 ENCOUNTER — Ambulatory Visit (INDEPENDENT_AMBULATORY_CARE_PROVIDER_SITE_OTHER): Payer: Managed Care, Other (non HMO) | Admitting: Surgery

## 2011-01-25 VITALS — BP 124/82 | HR 68 | Temp 97.8°F | Resp 16 | Ht 65.0 in | Wt 140.0 lb

## 2011-01-25 DIAGNOSIS — Z09 Encounter for follow-up examination after completed treatment for conditions other than malignant neoplasm: Secondary | ICD-10-CM

## 2011-01-25 NOTE — Progress Notes (Signed)
Subjective:     Patient ID: Julie Snyder, female   DOB: January 28, 1980, 31 y.o.   MRN: [id number]  HPI She is here for another visit. She is doing hydrogel daily to the breast wound  Review of Systems     Objective:   Physical Exam On exam, the wound is cleaner today. I treated the granulation tissue with silver nitrate. There is no evidence of infection    Assessment:     Patient status post excision of right breast mass with postop wound breakdown    Plan:     We will continue to hydrogel dressing changes and I will see her back in 2 weeks

## 2011-02-09 ENCOUNTER — Ambulatory Visit (INDEPENDENT_AMBULATORY_CARE_PROVIDER_SITE_OTHER): Payer: Managed Care, Other (non HMO) | Admitting: Surgery

## 2011-02-09 ENCOUNTER — Encounter (INDEPENDENT_AMBULATORY_CARE_PROVIDER_SITE_OTHER): Payer: Self-pay | Admitting: Surgery

## 2011-02-09 VITALS — BP 124/77 | HR 80 | Temp 98.6°F | Resp 18 | Ht 65.0 in | Wt 141.6 lb

## 2011-02-09 DIAGNOSIS — Z09 Encounter for follow-up examination after completed treatment for conditions other than malignant neoplasm: Secondary | ICD-10-CM

## 2011-02-09 NOTE — Progress Notes (Signed)
Subjective:     Patient ID: Julie Snyder, female   DOB: 02/10/1980, 32 y.o.   MRN: [id number]  HPI She is here for another wound check of the right breast. She has no complaints. She reports it is no longer draining  Review of Systems     Objective:   Physical Exam On exam, the incision has contracted. There is a small scab in minimal swelling with no evidence of infection    Assessment:     Patient with right breast wound status post lumpectomy    Plan:     She will continue her local wound care. He is doing quite well so I will see her back in 3 months. She will start using scar cream

## 2011-03-20 ENCOUNTER — Other Ambulatory Visit: Payer: Self-pay | Admitting: Women's Health

## 2011-03-31 ENCOUNTER — Other Ambulatory Visit (HOSPITAL_COMMUNITY)
Admission: RE | Admit: 2011-03-31 | Discharge: 2011-03-31 | Disposition: A | Discharge status: Home / Self Care | Payer: Managed Care, Other (non HMO) | Source: Ambulatory Visit | Attending: Obstetrics and Gynecology | Admitting: Obstetrics and Gynecology

## 2011-03-31 ENCOUNTER — Ambulatory Visit (INDEPENDENT_AMBULATORY_CARE_PROVIDER_SITE_OTHER): Payer: Managed Care, Other (non HMO) | Admitting: Women's Health

## 2011-03-31 ENCOUNTER — Encounter: Payer: Self-pay | Admitting: Women's Health

## 2011-03-31 ENCOUNTER — Telehealth: Payer: Self-pay | Admitting: *Deleted

## 2011-03-31 VITALS — BP 120/78 | Ht 64.5 in | Wt 148.0 lb

## 2011-03-31 DIAGNOSIS — Z01419 Encounter for gynecological examination (general) (routine) without abnormal findings: Secondary | ICD-10-CM

## 2011-03-31 DIAGNOSIS — E079 Disorder of thyroid, unspecified: Secondary | ICD-10-CM

## 2011-03-31 DIAGNOSIS — Z309 Encounter for contraceptive management, unspecified: Secondary | ICD-10-CM

## 2011-03-31 DIAGNOSIS — Z113 Encounter for screening for infections with a predominantly sexual mode of transmission: Secondary | ICD-10-CM

## 2011-03-31 DIAGNOSIS — N898 Other specified noninflammatory disorders of vagina: Secondary | ICD-10-CM

## 2011-03-31 DIAGNOSIS — IMO0001 Reserved for inherently not codable concepts without codable children: Secondary | ICD-10-CM

## 2011-03-31 LAB — CBC WITH DIFFERENTIAL/PLATELET
Eosinophils Absolute: 0.1 10*3/uL (ref 0.0–0.7)
Eosinophils Relative: 1 % (ref 0–5)
HCT: 33.4 % — ABNORMAL LOW (ref 36.0–46.0)
Lymphs Abs: 1.4 10*3/uL (ref 0.7–4.0)
MCH: 26.8 pg (ref 26.0–34.0)
MCV: 78.4 fL (ref 78.0–100.0)
Monocytes Absolute: 0.5 10*3/uL (ref 0.1–1.0)
Platelets: 326 10*3/uL (ref 150–400)
RBC: 4.26 MIL/uL (ref 3.87–5.11)

## 2011-03-31 LAB — TSH: TSH: 0.357 u[IU]/mL (ref 0.350–4.500)

## 2011-03-31 MED ORDER — NORGESTIM-ETH ESTRAD TRIPHASIC 0.18/0.215/0.25 MG-35 MCG PO TABS: 1.0000 | ORAL_TABLET | Freq: Every day | ORAL | Status: DC

## 2011-03-31 MED ORDER — FLUCONAZOLE 100 MG PO TABS: ORAL_TABLET | ORAL | Status: DC

## 2011-03-31 NOTE — Telephone Encounter (Signed)
Had to change diflucan rx to 150mg  because 100mg  not covered by insurance.
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
## 2011-03-31 NOTE — Patient Instructions (Signed)

## 2011-04-01 LAB — HEPATITIS C ANTIBODY: HCV Ab: NEGATIVE

## 2011-04-01 LAB — GC/CHLAMYDIA PROBE AMP, GENITAL
Chlamydia, DNA Probe: NEGATIVE
GC Probe Amp, Genital: NEGATIVE

## 2011-04-26 ENCOUNTER — Ambulatory Visit (INDEPENDENT_AMBULATORY_CARE_PROVIDER_SITE_OTHER): Payer: Managed Care, Other (non HMO) | Admitting: Surgery

## 2011-04-26 ENCOUNTER — Encounter (INDEPENDENT_AMBULATORY_CARE_PROVIDER_SITE_OTHER): Payer: Self-pay | Admitting: Surgery

## 2011-04-26 VITALS — BP 122/84 | HR 80 | Temp 97.6°F | Resp 12 | Ht 65.0 in | Wt 146.8 lb

## 2011-04-26 DIAGNOSIS — L905 Scar conditions and fibrosis of skin: Secondary | ICD-10-CM

## 2011-04-26 NOTE — Progress Notes (Signed)
Subjective:     Patient ID: Julie Snyder, female   DOB: 14-Dec-1980, 31 y.o.   MRN: [id number]  HPI She is here for a final check of the right breast scar. She reports ago occasionally swell as had no drainage. She has been using Mederma  Review of Systems     Objective:   Physical Exam On exam, scars well-healed without evidence of infection on the right breast    Assessment:     Healed right breast scar status post excision of benign cyst    Plan:     I will see her back as needed. She will return if there is any erythema or she decided she would scar revision

## 2011-05-11 ENCOUNTER — Other Ambulatory Visit: Payer: Self-pay | Admitting: *Deleted

## 2011-05-11 DIAGNOSIS — IMO0001 Reserved for inherently not codable concepts without codable children: Secondary | ICD-10-CM

## 2011-05-11 MED ORDER — NORGESTIM-ETH ESTRAD TRIPHASIC 0.18/0.215/0.25 MG-35 MCG PO TABS: 1.0000 | ORAL_TABLET | Freq: Every day | ORAL | Status: DC

## 2011-05-11 NOTE — Progress Notes (Signed)
Pharmacy requested 90-day rx

## 2011-06-27 ENCOUNTER — Ambulatory Visit (INDEPENDENT_AMBULATORY_CARE_PROVIDER_SITE_OTHER): Payer: Managed Care, Other (non HMO) | Admitting: Women's Health

## 2011-06-27 ENCOUNTER — Encounter: Payer: Self-pay | Admitting: Women's Health

## 2011-06-27 DIAGNOSIS — N898 Other specified noninflammatory disorders of vagina: Secondary | ICD-10-CM

## 2011-06-27 LAB — WET PREP FOR TRICH, YEAST, CLUE
Trich, Wet Prep: NONE SEEN
Yeast Wet Prep HPF POC: NONE SEEN

## 2011-06-27 NOTE — Patient Instructions (Signed)
Monilial Vaginitis Vaginitis in a soreness, swelling and redness (inflammation) of the vagina and vulva. Monilial vaginitis is not a sexually transmitted infection. CAUSES  Yeast vaginitis is caused by yeast (candida) that is normally found in your vagina. With a yeast infection, the candida has overgrown in number to a point that upsets the chemical balance. SYMPTOMS   White, thick vaginal discharge.   Swelling, itching, redness and irritation of the vagina and possibly the lips of the vagina (vulva).   Burning or painful urination.   Painful intercourse.  DIAGNOSIS  Things that may contribute to monilial vaginitis are:  Postmenopausal and virginal states.   Pregnancy.   Infections.   Being tired, sick or stressed, especially if you had monilial vaginitis in the past.   Diabetes. Good control will help lower the chance.   Birth control pills.   Tight fitting garments.   Using bubble bath, feminine sprays, douches or deodorant tampons.   Taking certain medications that kill germs (antibiotics).   Sporadic recurrence can occur if you become ill.  TREATMENT  Your caregiver will give you medication.  There are several kinds of anti monilial vaginal creams and suppositories specific for monilial vaginitis. For recurrent yeast infections, use a suppository or cream in the vagina 2 times a week, or as directed.   Anti-monilial or steroid cream for the itching or irritation of the vulva may also be used. Get your caregiver's permission.   Painting the vagina with methylene blue solution may help if the monilial cream does not work.   Eating yogurt may help prevent monilial vaginitis.  HOME CARE INSTRUCTIONS   Finish all medication as prescribed.   Do not have sex until treatment is completed or after your caregiver tells you it is okay.   Take warm sitz baths.   Do not douche.   Do not use tampons, especially scented ones.   Wear cotton underwear.   Avoid tight  pants and panty hose.   Tell your sexual partner that you have a yeast infection. They should go to their caregiver if they have symptoms such as mild rash or itching.   Your sexual partner should be treated as well if your infection is difficult to eliminate.   Practice safer sex. Use condoms.   Some vaginal medications cause latex condoms to fail. Vaginal medications that harm condoms are:   Cleocin cream.   Butoconazole (Femstat).   Terconazole (Terazol) vaginal suppository.   Miconazole (Monistat) (may be purchased over the counter).  SEEK MEDICAL CARE IF:   You have a temperature by mouth above 102 F (38.9 C).   The infection is getting worse after 2 days of treatment.   The infection is not getting better after 3 days of treatment.   You develop blisters in or around your vagina.   You develop vaginal bleeding, and it is not your menstrual period.   You have pain when you urinate.   You develop intestinal problems.   You have pain with sexual intercourse.  Document Released: 10/13/2004 Document Revised: 12/23/2010 Document Reviewed: 06/27/2008 [employer] Patient Information 2012 [employer]. 

## 2011-06-27 NOTE — Progress Notes (Signed)
Patient ID: Julie Snyder, female   DOB: [birth year], 31 y.o.   MRN: [id number] Presents with the complaint of increased vaginal discharge with irritation. Same partner. Monthly cycle on Tri-Cyclen. Has had symptoms for several days and used Diflucan 100. Returned from a week beach trip.  Exam: External genitalia erythematous, speculum exam vaginal walls erythematous with white discharge with no odor. Wet prep negative.  Probable yeast  Plan: Diflucan 200 by mouth today, call if no relief of symptoms. Yeast prevention discussed.

## 2011-07-26 ENCOUNTER — Telehealth: Payer: Self-pay | Admitting: *Deleted

## 2011-07-26 NOTE — Telephone Encounter (Signed)
Recommend office visit

## 2011-07-26 NOTE — Telephone Encounter (Signed)
(  Nancy pt )Pt calling c/o recurrent yeast infection, c/o white discharge, no itching, no burning. Pt was given diflucan 200 mg po on office visit 06/27/11. Please advise

## 2011-07-26 NOTE — Telephone Encounter (Signed)
Pt informed with the below note. 

## 2011-07-27 ENCOUNTER — Encounter: Payer: Self-pay | Admitting: Gynecology

## 2011-07-27 ENCOUNTER — Ambulatory Visit (INDEPENDENT_AMBULATORY_CARE_PROVIDER_SITE_OTHER): Payer: Managed Care, Other (non HMO) | Admitting: Gynecology

## 2011-07-27 DIAGNOSIS — N760 Acute vaginitis: Secondary | ICD-10-CM

## 2011-07-27 DIAGNOSIS — B9689 Other specified bacterial agents as the cause of diseases classified elsewhere: Secondary | ICD-10-CM

## 2011-07-27 DIAGNOSIS — N898 Other specified noninflammatory disorders of vagina: Secondary | ICD-10-CM

## 2011-07-27 DIAGNOSIS — A499 Bacterial infection, unspecified: Secondary | ICD-10-CM

## 2011-07-27 LAB — WET PREP FOR TRICH, YEAST, CLUE

## 2011-07-27 MED ORDER — METRONIDAZOLE 500 MG PO TABS: 500.0000 mg | ORAL_TABLET | Freq: Two times a day (BID) | ORAL | Status: AC

## 2011-07-27 NOTE — Patient Instructions (Signed)
Follow up if vaginal discharge symptoms continue or recur. Call me in follow up of your trial of NuvaRing.

## 2011-07-27 NOTE — Progress Notes (Signed)
Patient presents having been treated for yeast by Harriett Sine in June. Patient has her symptoms have persisted despite taking the Diflucan. Also stopped taking her oral contraceptives due to nausea. Apparently tried several branches with continued nausea.  Exam was Sherrilyn Rist assistant Pelvic: External BUS vagina with white discharge. Cervix normal. Uterus normal size midline mobile nontender. Adnexa without masses or tenderness.  Assessment and plan: 1. White discharge. Wet prep consistent with bacterial vaginosis. We'll treat with Flagyl 500 twice a day x7 days, alcohol points reviewed. 2. Birth Control. I reviewed birth control options with her to include pill patch ring Depo-Provera Implanon IUD. Patients interested in trying the ring to see if this doesn't avoid the nausea. She is due for her period now. I gave her a sample NuvaRing with an instruction booklet. She will go ahead and start this after her menses and call me during the first ring if doing well that I will refill through her next annual March 2014.

## 2011-09-02 ENCOUNTER — Encounter: Payer: Self-pay | Admitting: Gynecology

## 2011-09-02 ENCOUNTER — Ambulatory Visit (INDEPENDENT_AMBULATORY_CARE_PROVIDER_SITE_OTHER): Payer: Managed Care, Other (non HMO) | Admitting: Gynecology

## 2011-09-02 DIAGNOSIS — A499 Bacterial infection, unspecified: Secondary | ICD-10-CM

## 2011-09-02 DIAGNOSIS — B3731 Acute candidiasis of vulva and vagina: Secondary | ICD-10-CM

## 2011-09-02 DIAGNOSIS — L293 Anogenital pruritus, unspecified: Secondary | ICD-10-CM

## 2011-09-02 DIAGNOSIS — N760 Acute vaginitis: Secondary | ICD-10-CM

## 2011-09-02 DIAGNOSIS — B9689 Other specified bacterial agents as the cause of diseases classified elsewhere: Secondary | ICD-10-CM

## 2011-09-02 DIAGNOSIS — N898 Other specified noninflammatory disorders of vagina: Secondary | ICD-10-CM

## 2011-09-02 DIAGNOSIS — B373 Candidiasis of vulva and vagina: Secondary | ICD-10-CM

## 2011-09-02 LAB — WET PREP FOR TRICH, YEAST, CLUE: Trich, Wet Prep: NONE SEEN

## 2011-09-02 MED ORDER — METRONIDAZOLE 500 MG PO TABS: 500.0000 mg | ORAL_TABLET | Freq: Two times a day (BID) | ORAL | Status: AC

## 2011-09-02 MED ORDER — FLUCONAZOLE 150 MG PO TABS: 150.0000 mg | ORAL_TABLET | Freq: Once | ORAL | Status: AC

## 2011-09-02 NOTE — Progress Notes (Signed)
Patient presents with one-day history of vaginal discharge and irritation. Past history of yeast and BV multiple times this past year.  Exam with Selena Batten assistant External BUS vagina with frothy abundant white discharge. Cervix normal. Uterus normal size midline mobile nontender. Adnexa without masses or tenderness.  Assessment and plan: Wet prep positive for yeast and BV. We'll treat with Diflucan 150x1 dose and Flagyl 500 twice a day x7 days. Long-term suppressive therapy options reviewed to include boric acid suppositories, weekly Diflucan, weekly metronidazole. Given that she oscillates between yeast and BV question would be whether to treat prophylactically for both. Patient is not interested in trying prophylaxis at this time. She'll call me if she changes her mind. I did give her 3 additional Diflucan to have available to treat if she has symptoms to take one pill if it does not clear them to present for evaluation. If she does well then she'll see me in March when she is due for her annual exam.

## 2011-09-02 NOTE — Patient Instructions (Signed)
Take Diflucan yeast pill times one and Flagyl twice daily for 7 days, avoid alcohol. Use additional Diflucan pills as needed for vaginal discharge or irritation. Follow up in March 2014 for your and exam.

## 2011-09-29 ENCOUNTER — Telehealth: Payer: Self-pay | Admitting: *Deleted

## 2011-09-29 MED ORDER — METRONIDAZOLE 0.75 % VA GEL: 1.0000 | Freq: Two times a day (BID) | VAGINAL | Status: AC

## 2011-09-29 NOTE — Telephone Encounter (Signed)
Pt informed with the below. 

## 2011-09-29 NOTE — Telephone Encounter (Signed)
Pt was given diflucan and flagyl on 09/02/11, pt was told to call back if symptoms no completely clear. Still c/o discharge with BV some relief but no 100%. Pt is requesting Metrogel as discussed in OV. Please advise

## 2011-09-29 NOTE — Telephone Encounter (Signed)
MetroGel twice daily x5 days 

## 2011-10-05 ENCOUNTER — Telehealth: Payer: Self-pay | Admitting: *Deleted

## 2011-10-05 MED ORDER — FLUCONAZOLE 150 MG PO TABS: 150.0000 mg | ORAL_TABLET | Freq: Once | ORAL | Status: DC

## 2011-10-05 NOTE — Telephone Encounter (Signed)
Pt calling c/o vaginal itching only, she would like a rx for diflucan. Please advise

## 2011-10-05 NOTE — Telephone Encounter (Signed)
rx sent to pharmacy

## 2011-10-05 NOTE — Telephone Encounter (Signed)
Okay, Diflucan 150 by mouth x1 dose, office visit if no relief

## 2011-10-11 ENCOUNTER — Encounter: Payer: Self-pay | Admitting: *Deleted

## 2011-10-11 NOTE — Progress Notes (Signed)
Patient ID: Julie Snyder, female   DOB: 1980/08/06, 31 y.o.   MRN: [id number] Pt calling c/o yeast infection itching, left message on pt voicemail. OV best.

## 2011-11-17 ENCOUNTER — Encounter: Payer: Self-pay | Admitting: Women's Health

## 2011-11-17 ENCOUNTER — Ambulatory Visit (INDEPENDENT_AMBULATORY_CARE_PROVIDER_SITE_OTHER): Payer: Managed Care, Other (non HMO) | Admitting: Women's Health

## 2011-11-17 DIAGNOSIS — Z113 Encounter for screening for infections with a predominantly sexual mode of transmission: Secondary | ICD-10-CM

## 2011-11-17 DIAGNOSIS — N898 Other specified noninflammatory disorders of vagina: Secondary | ICD-10-CM

## 2011-11-17 LAB — WET PREP FOR TRICH, YEAST, CLUE: Yeast Wet Prep HPF POC: NONE SEEN

## 2011-11-17 MED ORDER — METRONIDAZOLE 0.75 % VA GEL: VAGINAL | Status: DC

## 2011-11-17 NOTE — Progress Notes (Signed)
Patient ID: Julie Snyder, female   DOB: May 15, 1980, 31 y.o.   MRN: [id number] Presents with complaint of vaginal discharge with odor and irritation for one week. Has had problems with recurrent BV and/or yeast. Reports relief of symptoms after treatment but symptoms return. Same partner but there was a breakup several months ago. Denies any urinary symptoms, fever or abdominal pain. Contraceptives on Ortho Tri-Cyclen.  Exam: Abdomen soft nontender, external genitalia erythematous at introitus, speculum exam moderate amount of milky adherent discharge with odor noted wet prep positive for amines, clues, and TNTC bacteria. GC/Chlamydia culture taken. Bimanual no adnexal fullness or tenderness discomfort mostly in the vaginal area.  Recurrent BV  Plan: MetroGel vaginal cream 1 applicator at bedtime x5. Boric acid gel caps 600 mg twice weekly after resolution of symptoms. Instructed to return to office if symptoms persist after MetroGel.

## 2011-11-17 NOTE — Addendum Note (Signed)
Addended by: Leonard Schwartz A on: 11/17/2011 10:22 AM   Modules accepted: Orders

## 2011-11-21 ENCOUNTER — Other Ambulatory Visit: Payer: Self-pay | Admitting: Women's Health

## 2011-11-21 ENCOUNTER — Telehealth: Payer: Self-pay | Admitting: *Deleted

## 2011-11-21 DIAGNOSIS — IMO0001 Reserved for inherently not codable concepts without codable children: Secondary | ICD-10-CM

## 2011-11-21 MED ORDER — LEVONORGESTREL-ETHINYL ESTRAD 0.1-20 MG-MCG PO TABS: 1.0000 | ORAL_TABLET | Freq: Every day | ORAL | Status: DC

## 2011-11-21 NOTE — Telephone Encounter (Signed)
Telephone call, reviewed had tried Loestrin 1/20 and Ortho Tri-Cyclen in the past. Reviewed other contraceptions declines would like to try a different pill. Will try Alesse, instructed to call if problems, reviewed slight risk for blood clots and strokes. Start up instructions given and importance of condoms especially first month.

## 2011-11-21 NOTE — Telephone Encounter (Signed)
Pt called requesting to start on birth control pills. Pt said that she has tired several different types and has spoke with you about this. She asked if she could start on a trial of pill to see how her body does with this. Please advise

## 2011-12-21 ENCOUNTER — Ambulatory Visit: Payer: Managed Care, Other (non HMO) | Admitting: Gynecology

## 2011-12-22 ENCOUNTER — Ambulatory Visit (INDEPENDENT_AMBULATORY_CARE_PROVIDER_SITE_OTHER): Payer: Managed Care, Other (non HMO) | Admitting: Women's Health

## 2011-12-22 ENCOUNTER — Encounter: Payer: Self-pay | Admitting: Women's Health

## 2011-12-22 DIAGNOSIS — N898 Other specified noninflammatory disorders of vagina: Secondary | ICD-10-CM

## 2011-12-22 DIAGNOSIS — Z23 Encounter for immunization: Secondary | ICD-10-CM

## 2011-12-22 LAB — WET PREP FOR TRICH, YEAST, CLUE
Clue Cells Wet Prep HPF POC: NONE SEEN
Trich, Wet Prep: NONE SEEN

## 2011-12-22 NOTE — Progress Notes (Signed)
Patient ID: Julie Snyder, female   DOB: 03-08-1980, 31 y.o.   MRN: [id number] Presents with complaint of a  brownvaginal discharge with an odor for one day. Denies discharge today or odor. Has had problems with recurrent BV. Currently using half an applicator of MetroGel after her menstrual cycle and Boric acid gelcaps twice weekly. Denies urinary symptoms, fever, abdominal pain. 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  Exam: Appears well, abdomen soft, nontender, external genitalia within normal limits. Speculum exam cervix pink MetroGel appearing gel present, wet prep from vaginal wall vaginal walls not erythematous. No odor noted, wet prep negative. Bimanual no CMT or adnexal fullness or tenderness.  Resolved BV  Plan: Continue half applicator of MetroGel after cycle and boric acid twice weekly. Reviewed normality of exam and wet prep. Dtap vaccine given.

## 2012-04-11 ENCOUNTER — Encounter: Payer: Self-pay | Admitting: Women's Health

## 2012-04-11 ENCOUNTER — Ambulatory Visit (INDEPENDENT_AMBULATORY_CARE_PROVIDER_SITE_OTHER): Payer: BC Managed Care – PPO | Admitting: Women's Health

## 2012-04-11 VITALS — BP 114/70 | Ht 65.0 in | Wt 146.0 lb

## 2012-04-11 DIAGNOSIS — Z13220 Encounter for screening for lipoid disorders: Secondary | ICD-10-CM

## 2012-04-11 DIAGNOSIS — Z01419 Encounter for gynecological examination (general) (routine) without abnormal findings: Secondary | ICD-10-CM

## 2012-04-11 DIAGNOSIS — Z833 Family history of diabetes mellitus: Secondary | ICD-10-CM

## 2012-04-11 DIAGNOSIS — N898 Other specified noninflammatory disorders of vagina: Secondary | ICD-10-CM

## 2012-04-11 DIAGNOSIS — N63 Unspecified lump in unspecified breast: Secondary | ICD-10-CM

## 2012-04-11 DIAGNOSIS — N6310 Unspecified lump in the right breast, unspecified quadrant: Secondary | ICD-10-CM

## 2012-04-11 LAB — CBC WITH DIFFERENTIAL/PLATELET
Basophils Absolute: 0 10*3/uL (ref 0.0–0.1)
Eosinophils Absolute: 0.1 10*3/uL (ref 0.0–0.7)
Eosinophils Relative: 1 % (ref 0–5)
MCH: 27.6 pg (ref 26.0–34.0)
MCV: 80 fL (ref 78.0–100.0)
Platelets: 347 10*3/uL (ref 150–400)
RDW: 15.3 % (ref 11.5–15.5)
WBC: 7.7 10*3/uL (ref 4.0–10.5)

## 2012-04-11 LAB — WET PREP FOR TRICH, YEAST, CLUE
Trich, Wet Prep: NONE SEEN
Yeast Wet Prep HPF POC: NONE SEEN

## 2012-04-11 LAB — LIPID PANEL
HDL: 58 mg/dL (ref >39)
Triglycerides: 43 mg/dL (ref <150)

## 2012-04-11 NOTE — Patient Instructions (Addendum)
Wt 146  BMI 24 Health Maintenance, Females A healthy lifestyle and preventative care can promote health and wellness.  Maintain regular health, dental, and eye exams.  Eat a healthy diet. Foods like vegetables, fruits, whole grains, low-fat dairy products, and lean protein foods contain the nutrients you need without too many calories. Decrease your intake of foods high in solid fats, added sugars, and salt. Get information about a proper diet from your caregiver, if necessary.  Regular physical exercise is one of the most important things you can do for your health. Most adults should get at least 150 minutes of moderate-intensity exercise (any activity that increases your heart rate and causes you to sweat) each week. In addition, most adults need muscle-strengthening exercises on 2 or more days a week.   Maintain a healthy weight. The body mass index (BMI) is a screening tool to identify possible weight problems. It provides an estimate of body fat based on height and weight. Your caregiver can help determine your BMI, and can help you achieve or maintain a healthy weight. For adults 20 years and older:  A BMI below 18.5 is considered underweight.  A BMI of 18.5 to 24.9 is normal.  A BMI of 25 to 29.9 is considered overweight.  A BMI of 30 and above is considered obese.  Maintain normal blood lipids and cholesterol by exercising and minimizing your intake of saturated fat. Eat a balanced diet with plenty of fruits and vegetables. Blood tests for lipids and cholesterol should begin at age 76 and be repeated every 5 years. If your lipid or cholesterol levels are high, you are over 50, or you are a high risk for heart disease, you may need your cholesterol levels checked more frequently.Ongoing high lipid and cholesterol levels should be treated with medicines if diet and exercise are not effective.  If you smoke, find out from your caregiver how to quit. If you do not use tobacco, do not  start.  If you are pregnant, do not drink alcohol. If you are breastfeeding, be very cautious about drinking alcohol. If you are not pregnant and choose to drink alcohol, do not exceed 1 drink per day. One drink is considered to be 12 ounces (355 mL) of beer, 5 ounces (148 mL) of wine, or 1.5 ounces (44 mL) of liquor.  Avoid use of street drugs. Do not share needles with anyone. Ask for help if you need support or instructions about stopping the use of drugs.  High blood pressure causes heart disease and increases the risk of stroke. Blood pressure should be checked at least every 1 to 2 years. Ongoing high blood pressure should be treated with medicines, if weight loss and exercise are not effective.  If you are 10 to 32 years old, ask your caregiver if you should take aspirin to prevent strokes.  Diabetes screening involves taking a blood sample to check your fasting blood sugar level. This should be done once every 3 years, after age 27, if you are within normal weight and without risk factors for diabetes. Testing should be considered at a younger age or be carried out more frequently if you are overweight and have at least 1 risk factor for diabetes.  Breast cancer screening is essential preventative care for women. You should practice "breast self-awareness." This means understanding the normal appearance and feel of your breasts and may include breast self-examination. Any changes detected, no matter how small, should be reported to a caregiver. Women in their  20s and 30s should have a clinical breast exam (CBE) by a caregiver as part of a regular health exam every 1 to 3 years. After age 44, women should have a CBE every year. Starting at age 61, women should consider having a mammogram (breast X-ray) every year. Women who have a family history of breast cancer should talk to their caregiver about genetic screening. Women at a high risk of breast cancer should talk to their caregiver about having  an MRI and a mammogram every year.  The Pap test is a screening test for cervical cancer. Women should have a Pap test starting at age 64. Between ages 69 and 72, Pap tests should be repeated every 2 years. Beginning at age 78, you should have a Pap test every 3 years as long as the past 3 Pap tests have been normal. If you had a hysterectomy for a problem that was not cancer or a condition that could lead to cancer, then you no longer need Pap tests. If you are between ages 32 and 53, and you have had normal Pap tests going back 10 years, you no longer need Pap tests. If you have had past treatment for cervical cancer or a condition that could lead to cancer, you need Pap tests and screening for cancer for at least 20 years after your treatment. If Pap tests have been discontinued, risk factors (such as a new sexual partner) need to be reassessed to determine if screening should be resumed. Some women have medical problems that increase the chance of getting cervical cancer. In these cases, your caregiver may recommend more frequent screening and Pap tests.  The human papillomavirus (HPV) test is an additional test that may be used for cervical cancer screening. The HPV test looks for the virus that can cause the cell changes on the cervix. The cells collected during the Pap test can be tested for HPV. The HPV test could be used to screen women aged 77 years and older, and should be used in women of any age who have unclear Pap test results. After the age of 31, women should have HPV testing at the same frequency as a Pap test.  Colorectal cancer can be detected and often prevented. Most routine colorectal cancer screening begins at the age of 7 and continues through age 67. However, your caregiver may recommend screening at an earlier age if you have risk factors for colon cancer. On a yearly basis, your caregiver may provide home test kits to check for hidden blood in the stool. Use of a small camera at  the end of a tube, to directly examine the colon (sigmoidoscopy or colonoscopy), can detect the earliest forms of colorectal cancer. Talk to your caregiver about this at age 68, when routine screening begins. Direct examination of the colon should be repeated every 5 to 10 years through age 61, unless early forms of pre-cancerous polyps or small growths are found.  Hepatitis C blood testing is recommended for all people born from [birth year] through 1965 and any individual with known risks for hepatitis C.  Practice safe sex. Use condoms and avoid high-risk sexual practices to reduce the spread of sexually transmitted infections (STIs). Sexually active women aged 16 and younger should be checked for Chlamydia, which is a common sexually transmitted infection. Older women with new or multiple partners should also be tested for Chlamydia. Testing for other STIs is recommended if you are sexually active and at increased risk.  Osteoporosis is  a disease in which the bones lose minerals and strength with aging. This can result in serious bone fractures. The risk of osteoporosis can be identified using a bone density scan. Women ages 43 and over and women at risk for fractures or osteoporosis should discuss screening with their caregivers. Ask your caregiver whether you should be taking a calcium supplement or vitamin D to reduce the rate of osteoporosis.  Menopause can be associated with physical symptoms and risks. Hormone replacement therapy is available to decrease symptoms and risks. You should talk to your caregiver about whether hormone replacement therapy is right for you.  Use sunscreen with a sun protection factor (SPF) of 30 or greater. Apply sunscreen liberally and repeatedly throughout the day. You should seek shade when your shadow is shorter than you. Protect yourself by wearing long sleeves, pants, a wide-brimmed hat, and sunglasses year round, whenever you are outdoors.  Notify your caregiver of new  moles or changes in moles, especially if there is a change in shape or color. Also notify your caregiver if a mole is larger than the size of a pencil eraser.  Stay current with your immunizations. Document Released: 07/19/2010 Document Revised: 03/28/2011 Document Reviewed: 07/19/2010 [facility] Patient Information 2013 [place], [place].
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
## 2012-04-12 LAB — URINALYSIS W MICROSCOPIC + REFLEX CULTURE
Bilirubin Urine: NEGATIVE
Ketones, ur: NEGATIVE mg/dL
Protein, ur: NEGATIVE mg/dL
Urobilinogen, UA: 0.2 mg/dL (ref 0.0–1.0)

## 2012-04-15 LAB — URINE CULTURE: Colony Count: 15000

## 2012-05-15 ENCOUNTER — Encounter: Payer: Self-pay | Admitting: Women's Health

## 2012-11-22 ENCOUNTER — Other Ambulatory Visit: Payer: Self-pay

## 2012-12-05 ENCOUNTER — Ambulatory Visit: Payer: BC Managed Care – PPO | Admitting: Women's Health

## 2012-12-06 ENCOUNTER — Ambulatory Visit: Payer: BC Managed Care – PPO | Admitting: Women's Health

## 2012-12-12 ENCOUNTER — Ambulatory Visit (INDEPENDENT_AMBULATORY_CARE_PROVIDER_SITE_OTHER): Payer: BC Managed Care – PPO | Admitting: Women's Health

## 2012-12-12 ENCOUNTER — Encounter: Payer: Self-pay | Admitting: Women's Health

## 2012-12-12 DIAGNOSIS — N898 Other specified noninflammatory disorders of vagina: Secondary | ICD-10-CM

## 2012-12-12 DIAGNOSIS — B373 Candidiasis of vulva and vagina: Secondary | ICD-10-CM

## 2012-12-12 DIAGNOSIS — B3731 Acute candidiasis of vulva and vagina: Secondary | ICD-10-CM

## 2012-12-12 LAB — WET PREP FOR TRICH, YEAST, CLUE
Clue Cells Wet Prep HPF POC: NONE SEEN
Trich, Wet Prep: NONE SEEN

## 2012-12-12 MED ORDER — FLUCONAZOLE 150 MG PO TABS: 150.0000 mg | ORAL_TABLET | Freq: Once | ORAL | Status: DC

## 2012-12-12 NOTE — Progress Notes (Signed)
Patient ID: Julie Snyder, female   DOB: 07/03/1980, 31 y.o.   MRN: [id number] Presents with complaint of vaginal discharge with itching and irritation. Currently on amoxicillin for tonsillitis. New partner. Denies urinary symptoms. Monthly cycles/condoms.  Exam: Appears well, external genitalia extremely erythematous, speculum exam scant white discharge wet prep negative. GC/Chlamydia culture taken.  Clinical yeast  Plan: Contraception options reviewed and declined, will continue condoms. Plan B emergency contraception reviewed. Diflucan 150 by mouth x1 dose. Prescription, proper use given and reviewed. Instructed to call if no relief. GC/Chlamydia culture pending. Declines need for HIV, hepatitis or RPR.

## 2012-12-13 LAB — GC/CHLAMYDIA PROBE AMP
CT Probe RNA: NEGATIVE
GC Probe RNA: NEGATIVE

## 2013-02-08 ENCOUNTER — Ambulatory Visit (INDEPENDENT_AMBULATORY_CARE_PROVIDER_SITE_OTHER): Payer: BC Managed Care – PPO | Admitting: Women's Health

## 2013-02-08 ENCOUNTER — Encounter: Payer: Self-pay | Admitting: Women's Health

## 2013-02-08 DIAGNOSIS — B373 Candidiasis of vulva and vagina: Secondary | ICD-10-CM

## 2013-02-08 DIAGNOSIS — B3731 Acute candidiasis of vulva and vagina: Secondary | ICD-10-CM

## 2013-02-08 DIAGNOSIS — N898 Other specified noninflammatory disorders of vagina: Secondary | ICD-10-CM

## 2013-02-08 LAB — WET PREP FOR TRICH, YEAST, CLUE
Trich, Wet Prep: NONE SEEN
Yeast Wet Prep HPF POC: NONE SEEN

## 2013-02-08 MED ORDER — FLUCONAZOLE 150 MG PO TABS: 150.0000 mg | ORAL_TABLET | Freq: Once | ORAL | Status: DC

## 2013-02-08 MED ORDER — METRONIDAZOLE 0.75 % VA GEL: VAGINAL | Status: DC

## 2013-02-08 NOTE — Progress Notes (Signed)
Patient ID: Keyerra Lamere, female   DOB: 07-21-1980, 33 y.o.   MRN: [id number] Presents with malodorous vaginal discharge for past 2 days, chronic discharge for past year and a half. Denies use vaginal pruritus/irritation, urinary symptoms, fever , nausea, vomiting, abdominal/back pain. Monthly cycle every 32-35 days/withdrawal/same partner. Uses one half applicator of MetroGel post menses for bacterial vaginosis prevention.   Exam: Appears well. External genitalia unremarkable. Speculum exam scant white discharge, vaginal walls erythematous.  Wet prep: Positive for amines, clues, and TNTC bacteria  Chronic Bacterial vaginosis  Plan: MetroGel vaginal x5 applicators. Then MetroGel vaginal  one half applicator post menses.  Alcohol avoidance. Diflucan 150 mg by mouth x1 prescription given if vaginal itching after metrogel. Boric acid gelcaps, insert one vaginally twice a week after resolutions of symptoms. Prescription proper use given and reviewed. Advised to call if symptoms do not improve. Encouraged condoms, declines other contraception.

## 2013-06-11 ENCOUNTER — Encounter: Payer: BC Managed Care – PPO | Admitting: Women's Health

## 2013-06-25 ENCOUNTER — Encounter: Payer: Self-pay | Admitting: Women's Health

## 2013-06-25 ENCOUNTER — Ambulatory Visit (INDEPENDENT_AMBULATORY_CARE_PROVIDER_SITE_OTHER): Payer: BC Managed Care – PPO | Admitting: Women's Health

## 2013-06-25 VITALS — BP 108/64 | Ht 64.75 in | Wt 152.0 lb

## 2013-06-25 DIAGNOSIS — Z01419 Encounter for gynecological examination (general) (routine) without abnormal findings: Secondary | ICD-10-CM

## 2013-06-25 DIAGNOSIS — Z833 Family history of diabetes mellitus: Secondary | ICD-10-CM

## 2013-06-25 LAB — CBC WITH DIFFERENTIAL/PLATELET
Basophils Absolute: 0 10*3/uL (ref 0.0–0.1)
Basophils Relative: 0 % (ref 0–1)
Eosinophils Absolute: 0.1 10*3/uL (ref 0.0–0.7)
Eosinophils Relative: 1 % (ref 0–5)
HCT: 33.8 % — ABNORMAL LOW (ref 36.0–46.0)
Hemoglobin: 11.5 g/dL — ABNORMAL LOW (ref 12.0–15.0)
LYMPHS ABS: 2.3 10*3/uL (ref 0.7–4.0)
LYMPHS PCT: 35 % (ref 12–46)
MCH: 26 pg (ref 26.0–34.0)
MCHC: 34 g/dL (ref 30.0–36.0)
MCV: 76.5 fL — AB (ref 78.0–100.0)
Monocytes Absolute: 0.5 10*3/uL (ref 0.1–1.0)
Monocytes Relative: 7 % (ref 3–12)
Neutro Abs: 3.7 10*3/uL (ref 1.7–7.7)
Neutrophils Relative %: 57 % (ref 43–77)
PLATELETS: 286 10*3/uL (ref 150–400)
RBC: 4.42 MIL/uL (ref 3.87–5.11)
RDW: 16.3 % — ABNORMAL HIGH (ref 11.5–15.5)
WBC: 6.5 10*3/uL (ref 4.0–10.5)

## 2013-06-25 NOTE — Patient Instructions (Signed)

## 2013-06-25 NOTE — Progress Notes (Signed)
Julie Snyder 1980-05-18 [id number]    History:    Presents for annual exam. Regular monthly cycle/condoms/same partner.  History of benign R breast lumpectomy 2012. Normal pap 2013. History of recurrent BV and yeast infections was using  Metrogel weekly and boric acid twice weekly with significant relief-no symptoms for approx 2 months, has d/c'd medications. Used several oral contraceptives in the past prefers condoms.  Past medical history, past surgical history, family history and social history were all reviewed and documented in the EPIC chart. 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 Mother diabetes.   ROS:  A  12 point ROS was performed and pertinent positives and negatives are included.  Exam:  Filed Vitals:   06/25/13 1125  BP: 108/64    General appearance:  Normal Thyroid:  Symmetrical, normal in size, without palpable masses or nodularity. Respiratory  Auscultation:  Clear without wheezing or rhonchi Cardiovascular  Auscultation:  Regular rate, without rubs, murmurs or gallops  Edema/varicosities:  Not grossly evident Abdominal  Soft,nontender, without masses, guarding or rebound.  Liver/spleen:  No organomegaly noted  Hernia:  None appreciated  Skin  Inspection:  Grossly normal   Breasts: Examined lying and sitting.     Right: Without masses, retractions, discharge or axillary adenopathy.     Left: Without masses, retractions, discharge or axillary adenopathy. Gentitourinary   Inguinal/mons:  Normal without inguinal adenopathy  External genitalia:  Normal  BUS/Urethra/Skene's glands:  Normal  Vagina:  Normal  Cervix:  Normal  Uterus:  anteverted, normal in size, shape and contour.  Midline and mobile  Adnexa/parametria:     Rt: Without masses or tenderness.   Lt: Without masses or tenderness.  Anus and perineum: Normal  Digital rectal exam: Normal sphincter tone without palpated masses or  tenderness  Assessment/Plan:  33 y.o. SBF for annual exam.   Normal GYN exam/condoms  1. Health maintenance: SBE's, healthy diet/exercise, MVI daily, condom use reviewed, UA, CBC, glucose. Normal pap 2013 new screening guidelines reviewed.   Note: This dictation was prepared with Dragon/digital dictation.  Any transcriptional errors that result are unintentional. Huel Cote [facility], 11:48 AM 06/25/2013

## 2013-06-26 LAB — GLUCOSE, RANDOM: Glucose, Bld: 73 mg/dL (ref 70–99)

## 2013-06-26 LAB — URINALYSIS W MICROSCOPIC + REFLEX CULTURE
Bilirubin Urine: NEGATIVE
CASTS: NONE SEEN
CRYSTALS: NONE SEEN
Glucose, UA: NEGATIVE mg/dL
Hgb urine dipstick: NEGATIVE
Ketones, ur: NEGATIVE mg/dL
LEUKOCYTES UA: NEGATIVE
NITRITE: NEGATIVE
PH: 7.5 (ref 5.0–8.0)
Protein, ur: NEGATIVE mg/dL
SPECIFIC GRAVITY, URINE: 1.025 (ref 1.005–1.030)
Urobilinogen, UA: 0.2 mg/dL (ref 0.0–1.0)

## 2013-08-16 ENCOUNTER — Encounter: Payer: Self-pay | Admitting: Gynecology

## 2013-08-16 ENCOUNTER — Ambulatory Visit (INDEPENDENT_AMBULATORY_CARE_PROVIDER_SITE_OTHER): Payer: BC Managed Care – PPO | Admitting: Gynecology

## 2013-08-16 DIAGNOSIS — N898 Other specified noninflammatory disorders of vagina: Secondary | ICD-10-CM

## 2013-08-16 DIAGNOSIS — N912 Amenorrhea, unspecified: Secondary | ICD-10-CM

## 2013-08-16 LAB — WET PREP FOR TRICH, YEAST, CLUE
CLUE CELLS WET PREP: NONE SEEN
TRICH WET PREP: NONE SEEN

## 2013-08-16 LAB — PREGNANCY, URINE: Preg Test, Ur: NEGATIVE

## 2013-08-16 MED ORDER — FLUCONAZOLE 150 MG PO TABS: 150.0000 mg | ORAL_TABLET | Freq: Once | ORAL | Status: DC

## 2013-08-16 NOTE — Addendum Note (Signed)
Addended by: Nelva Nay on: 08/16/2013 04:21 PM   Modules accepted: Orders

## 2013-08-16 NOTE — Patient Instructions (Signed)
Take the Diflucan pill once. Save the other pill if you have a recurrence. Followup if her symptoms persist, worsen or recur.  Call if you do not start your period in another week or so.

## 2013-08-16 NOTE — Progress Notes (Signed)
Julie Snyder [birth year] [id number]        33 y.o.  G0P0 presents with one-week history of discharge with itching. No odor. No urinary symptoms such as frequency, dysuria or urgency. Just returned from the beach.also late for her menses with LMP 07/06/2013. Has had several episodes of being late in the past without pregnancy. Using condoms consistently.  Past medical history,surgical history, problem list, medications, allergies, family history and social history were all reviewed and documented in the EPIC chart.  Directed ROS with pertinent positives and negatives documented in the history of present illness/assessment and plan.  Exam: Kim assistant General appearance:  Normal Abdomen soft nontender without masses guarding rebound organomegaly. Pelvic external BUS vagina with white discharge. Cervix normal. Uterus normal size midline mobile nontender. Adnexa without masses or tenderness.  Assessment/Plan:  33 y.o. G0P0 with:  1. Vaginal discharge. Symptoms, exam and wet prep consistent with yeast. Will treat with Diflucan 150 mg x1 dose extra pill given to have to use if needed. 2. Late for menses. UPT negative. Monitor for another week or so.  If does not spontaneously start will call and consider progesterone withdrawal after recheck of a pregnancy test.   Note: This document was prepared with digital dictation and possible smart phrase technology. Any transcriptional errors that result from this process are unintentional.   Anastasio Auerbach MD, 3:47 PM 08/16/2013

## 2014-01-29 ENCOUNTER — Encounter: Payer: Self-pay | Admitting: Women's Health

## 2014-01-29 ENCOUNTER — Ambulatory Visit (INDEPENDENT_AMBULATORY_CARE_PROVIDER_SITE_OTHER): Payer: BLUE CROSS/BLUE SHIELD | Admitting: Women's Health

## 2014-01-29 VITALS — BP 120/82 | Ht 64.0 in | Wt 153.0 lb

## 2014-01-29 DIAGNOSIS — B373 Candidiasis of vulva and vagina: Secondary | ICD-10-CM

## 2014-01-29 DIAGNOSIS — A499 Bacterial infection, unspecified: Secondary | ICD-10-CM

## 2014-01-29 DIAGNOSIS — B9689 Other specified bacterial agents as the cause of diseases classified elsewhere: Secondary | ICD-10-CM

## 2014-01-29 DIAGNOSIS — B3731 Acute candidiasis of vulva and vagina: Secondary | ICD-10-CM

## 2014-01-29 DIAGNOSIS — N760 Acute vaginitis: Secondary | ICD-10-CM

## 2014-01-29 LAB — WET PREP FOR TRICH, YEAST, CLUE: TRICH WET PREP: NONE SEEN

## 2014-01-29 LAB — URINALYSIS W MICROSCOPIC + REFLEX CULTURE
Bilirubin Urine: NEGATIVE
Glucose, UA: NEGATIVE mg/dL
HGB URINE DIPSTICK: NEGATIVE
Ketones, ur: NEGATIVE mg/dL
Leukocytes, UA: NEGATIVE
Nitrite: NEGATIVE
Protein, ur: NEGATIVE mg/dL
Specific Gravity, Urine: 1.02 (ref 1.005–1.030)
Urobilinogen, UA: 0.2 mg/dL (ref 0.0–1.0)
pH: 6 (ref 5.0–8.0)

## 2014-01-29 MED ORDER — FLUCONAZOLE 150 MG PO TABS: ORAL_TABLET | ORAL | Status: DC

## 2014-01-29 MED ORDER — METRONIDAZOLE 0.75 % VA GEL: VAGINAL | Status: DC

## 2014-01-29 NOTE — Progress Notes (Signed)
Patient ID: Julie Snyder, female   DOB: 03/26/1980, 34 y.o.   MRN: [id number] Presents with complaint of vaginal discharge with irritation. History of recurrent yeast and BV. Had been using boric acid with good relief but stopped. Same partner. Monthly cycle/condoms. Denies urinary symptoms, abdominal pain or fever.  Exam: Appears well. External genitalia within normal limits, speculum exam scant white discharge, wet prep positive for few yeast, few clue cells. Bimanual no CMT or adnexal fullness or tenderness.  Yeast vaginitis Bacteria vaginosis  Plan: Diflucan 150 by mouth today and repeat in 3 days if needed, MetroGel vaginal cream 1 applicator at bedtime 5. Alcohol precautions reviewed. After symptoms resolve boric acid gelcaps twice weekly. Instructed to call if no relief.

## 2014-01-29 NOTE — Patient Instructions (Signed)
Bacterial Vaginosis Bacterial vaginosis is an infection of the vagina. It happens when too many of certain germs (bacteria) grow in the vagina. HOME CARE  Take your medicine as told by your doctor.  Finish your medicine even if you start to feel better.  Do not have sex until you finish your medicine and are better.  Tell your sex partner that you have an infection. They should see their doctor for treatment.  Practice safe sex. Use condoms. Have only one sex partner. GET HELP IF:  You are not getting better after 3 days of treatment.  You have more grey fluid (discharge) coming from your vagina than before.  You have more pain than before.  You have a fever. MAKE SURE YOU:   Understand these instructions.  Will watch your condition.  Will get help right away if you are not doing well or get worse. Document Released: 10/13/2007 Document Revised: 10/24/2012 Document Reviewed: 08/15/2012 ExitCare Patient Information 2015 [employer]. This information is not intended to replace advice given to you by your health care provider. Make sure you discuss any questions you have with your health care provider.  

## 2014-07-16 ENCOUNTER — Encounter: Payer: Self-pay | Admitting: Women's Health

## 2014-07-16 ENCOUNTER — Ambulatory Visit (INDEPENDENT_AMBULATORY_CARE_PROVIDER_SITE_OTHER): Payer: BLUE CROSS/BLUE SHIELD | Admitting: Women's Health

## 2014-07-16 ENCOUNTER — Other Ambulatory Visit (HOSPITAL_COMMUNITY)
Admission: RE | Admit: 2014-07-16 | Discharge: 2014-07-16 | Disposition: A | Discharge status: Home / Self Care | Payer: BLUE CROSS/BLUE SHIELD | Source: Ambulatory Visit | Attending: Women's Health | Admitting: Women's Health

## 2014-07-16 VITALS — BP 132/80 | Ht 65.0 in | Wt 155.0 lb

## 2014-07-16 DIAGNOSIS — Z01419 Encounter for gynecological examination (general) (routine) without abnormal findings: Secondary | ICD-10-CM | POA: Diagnosis present

## 2014-07-16 DIAGNOSIS — Z1151 Encounter for screening for human papillomavirus (HPV): Secondary | ICD-10-CM | POA: Insufficient documentation

## 2014-07-16 NOTE — Progress Notes (Signed)
Julie Snyder May 16, 1980 [id number]    History:    Presents for annual exam.  Regular monthly cycle/condoms/same partner. Normal Pap history. 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 Had problems with recurrent BV and yeast less problems in the past year.  Past medical history, past surgical history, family history and social history were all reviewed and documented in the EPIC chart. Recently graduated with Masters, job Comptroller college in Building control surveyor. Mother history of diabetes and hypertension.  ROS:  A ROS was performed and pertinent positives and negatives are included.  Exam:  Filed Vitals:   07/16/14 0817  BP: 132/80    General appearance:  Normal Thyroid:  Symmetrical, normal in size, without palpable masses or nodularity. Respiratory  Auscultation:  Clear without wheezing or rhonchi Cardiovascular  Auscultation:  Regular rate, without rubs, murmurs or gallops  Edema/varicosities:  Not grossly evident Abdominal  Soft,nontender, without masses, guarding or rebound.  Liver/spleen:  No organomegaly noted  Hernia:  None appreciated  Skin  Inspection:  Grossly normal   Breasts: Examined lying and sitting.     Right: Without masses, retractions, discharge or axillary adenopathy.     Left: Without masses, retractions, discharge or axillary adenopathy. Gentitourinary   Inguinal/mons:  Normal without inguinal adenopathy  External genitalia:  Normal  BUS/Urethra/Skene's glands:  Normal  Vagina:  Normal  Cervix:  Normal  Uterus:   normal in size, shape and contour.  Midline and mobile  Adnexa/parametria:     Rt: Without masses or tenderness.   Lt: Without masses or tenderness.  Anus and perineum: Normal  Digital rectal exam: Normal sphincter tone without palpated masses or tenderness  Assessment/Plan:  34 y.o. SBF G0 for annual exam with no complaints.  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  no labs normal labs at health screening at work reports normal blood sugar cholesterol and H&H. UA, Pap with HR HPV typing. New screening guidelines reviewed. Contraception options reviewed and declined will continue condoms.  Huel Cote [facility], 9:13 AM 07/16/2014

## 2014-07-16 NOTE — Addendum Note (Signed)
Addended by: Burnett Kanaris on: 07/16/2014 09:21 AM   Modules accepted: Orders

## 2014-07-16 NOTE — Patient Instructions (Signed)

## 2014-07-17 LAB — URINALYSIS W MICROSCOPIC + REFLEX CULTURE
Bilirubin Urine: NEGATIVE
Casts: NONE SEEN
Crystals: NONE SEEN
GLUCOSE, UA: NEGATIVE mg/dL
Hgb urine dipstick: NEGATIVE
Ketones, ur: NEGATIVE mg/dL
LEUKOCYTES UA: NEGATIVE
NITRITE: NEGATIVE
Protein, ur: NEGATIVE mg/dL
Specific Gravity, Urine: >1.03 — ABNORMAL HIGH (ref 1.005–1.030)
UROBILINOGEN UA: 0.2 mg/dL (ref 0.0–1.0)
pH: 6 (ref 5.0–8.0)

## 2014-07-17 LAB — CYTOLOGY - PAP

## 2014-07-18 LAB — URINE CULTURE
Colony Count: NO GROWTH
Organism ID, Bacteria: NO GROWTH

## 2015-05-15 ENCOUNTER — Ambulatory Visit: Payer: BLUE CROSS/BLUE SHIELD | Admitting: Women's Health

## 2015-07-08 ENCOUNTER — Ambulatory Visit: Payer: 59 | Admitting: Women's Health

## 2015-07-08 ENCOUNTER — Encounter: Payer: Self-pay | Admitting: Women's Health

## 2015-07-08 ENCOUNTER — Ambulatory Visit (INDEPENDENT_AMBULATORY_CARE_PROVIDER_SITE_OTHER): Payer: 59 | Admitting: Women's Health

## 2015-07-08 VITALS — BP 122/78 | Ht 65.0 in | Wt 155.0 lb

## 2015-07-08 DIAGNOSIS — B373 Candidiasis of vulva and vagina: Secondary | ICD-10-CM | POA: Diagnosis not present

## 2015-07-08 DIAGNOSIS — N898 Other specified noninflammatory disorders of vagina: Secondary | ICD-10-CM

## 2015-07-08 DIAGNOSIS — R350 Frequency of micturition: Secondary | ICD-10-CM

## 2015-07-08 DIAGNOSIS — B3731 Acute candidiasis of vulva and vagina: Secondary | ICD-10-CM

## 2015-07-08 LAB — URINALYSIS W MICROSCOPIC + REFLEX CULTURE
BILIRUBIN URINE: NEGATIVE
Casts: NONE SEEN [LPF]
Crystals: NONE SEEN [HPF]
GLUCOSE, UA: NEGATIVE
Hgb urine dipstick: NEGATIVE
KETONES UR: NEGATIVE
Leukocytes, UA: NEGATIVE
Nitrite: NEGATIVE
RBC / HPF: NONE SEEN RBC/HPF (ref <=2)
SPECIFIC GRAVITY, URINE: 1.025 (ref 1.001–1.035)
WBC, UA: NONE SEEN WBC/HPF (ref <=5)
Yeast: NONE SEEN [HPF]
pH: 7 (ref 5.0–8.0)

## 2015-07-08 LAB — WET PREP FOR TRICH, YEAST, CLUE
Clue Cells Wet Prep HPF POC: NONE SEEN
Trich, Wet Prep: NONE SEEN

## 2015-07-08 MED ORDER — FLUCONAZOLE 150 MG PO TABS: 150.0000 mg | ORAL_TABLET | Freq: Once | ORAL | Status: DC

## 2015-07-08 NOTE — Patient Instructions (Signed)

## 2015-07-08 NOTE — Progress Notes (Signed)
Patient ID: Julie Snyder, female   DOB: [birth year], 35 y.o.   MRN: [id number]  Presents with complaints of vaginal discharge with irritation, questionable odor and urinary burning for one week.  History of recurrent yeast infections.  Recent trip to [place] 3 weeks ago. Monthly cycles same partner condoms inconsistently.  Exam: Well-appearing.  External genitalia normal.  Speculum exam, no erythema, scant white discharge present.  Wet prep positive for moderate yeast/hyphae noted, rare WBC, moderate bacteria. UA Negative Leukocytes, Negative Blood, Negative Nitrites, 1+ Protein, No WBCs, No RBCs, Moderate Bacteria, Few Yeast  Yeast infection  Plan:  Diflucan 150mg  x1dose with one refill if symptoms do not resolve after first dose.  Call if symptoms do not respond to treatment.   Urine Culture pending. Declines other contraception., Pregnancy okay.

## 2015-07-09 LAB — URINE CULTURE: Colony Count: 15000

## 2015-07-17 ENCOUNTER — Ambulatory Visit (INDEPENDENT_AMBULATORY_CARE_PROVIDER_SITE_OTHER): Payer: 59 | Admitting: Women's Health

## 2015-07-17 ENCOUNTER — Encounter: Payer: Self-pay | Admitting: Women's Health

## 2015-07-17 VITALS — BP 126/80 | Ht 64.0 in | Wt 158.0 lb

## 2015-07-17 DIAGNOSIS — Z13220 Encounter for screening for lipoid disorders: Secondary | ICD-10-CM

## 2015-07-17 DIAGNOSIS — B3731 Acute candidiasis of vulva and vagina: Secondary | ICD-10-CM

## 2015-07-17 DIAGNOSIS — B373 Candidiasis of vulva and vagina: Secondary | ICD-10-CM

## 2015-07-17 DIAGNOSIS — Z833 Family history of diabetes mellitus: Secondary | ICD-10-CM | POA: Diagnosis not present

## 2015-07-17 DIAGNOSIS — Z01419 Encounter for gynecological examination (general) (routine) without abnormal findings: Secondary | ICD-10-CM

## 2015-07-17 LAB — CBC WITH DIFFERENTIAL/PLATELET
BASOS PCT: 0 %
Basophils Absolute: 0 cells/uL (ref 0–200)
Eosinophils Absolute: 172 cells/uL (ref 15–500)
Eosinophils Relative: 2 %
HEMATOCRIT: 38.1 % (ref 35.0–45.0)
Hemoglobin: 13.1 g/dL (ref 11.7–15.5)
LYMPHS PCT: 31 %
Lymphs Abs: 2666 cells/uL (ref 850–3900)
MCH: 28.4 pg (ref 27.0–33.0)
MCHC: 34.4 g/dL (ref 32.0–36.0)
MCV: 82.5 fL (ref 80.0–100.0)
MONO ABS: 602 {cells}/uL (ref 200–950)
MONOS PCT: 7 %
MPV: 11 fL (ref 7.5–12.5)
NEUTROS PCT: 60 %
Neutro Abs: 5160 cells/uL (ref 1500–7800)
PLATELETS: 295 10*3/uL (ref 140–400)
RBC: 4.62 MIL/uL (ref 3.80–5.10)
RDW: 13.8 % (ref 11.0–15.0)
WBC: 8.6 10*3/uL (ref 3.8–10.8)

## 2015-07-17 LAB — LIPID PANEL
Cholesterol: 198 mg/dL (ref 125–200)
HDL: 75 mg/dL (ref >=46)
LDL CALC: 109 mg/dL (ref <130)
TRIGLYCERIDES: 69 mg/dL (ref <150)
Total CHOL/HDL Ratio: 2.6 Ratio (ref <=5.0)
VLDL: 14 mg/dL (ref <30)

## 2015-07-17 LAB — GLUCOSE, RANDOM: GLUCOSE: 87 mg/dL (ref 65–99)

## 2015-07-17 MED ORDER — FLUCONAZOLE 150 MG PO TABS: 150.0000 mg | ORAL_TABLET | Freq: Once | ORAL | Status: DC

## 2015-07-17 NOTE — Progress Notes (Signed)
Julie Snyder 30-Jun-1980 [id number]    History:    Presents for annual exam.  Monthly 3-4 day cycles.  Same partner, inconsistent condom use, pregnancy  okay.  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  Normal Pap history.  Recent yeast infection 07/08/2015 has resolved.     Past medical history, past surgical history, family history and social history were all reviewed and documented in the EPIC chart.  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  Exercises 2-3x/week.  ROS:  A ROS was performed and pertinent positives and negatives are included.  Exam:  Filed Vitals:   07/17/15 0814  BP: 126/80    General appearance:  Normal Thyroid:  Symmetrical, normal in size, without palpable masses or nodularity. Respiratory  Auscultation:  Clear without wheezing or rhonchi Cardiovascular  Auscultation:  Regular rate, without rubs, murmurs or gallops  Edema/varicosities:  Not grossly evident Abdominal  Soft,nontender, without masses, guarding or rebound.  Liver/spleen:  No organomegaly noted  Hernia:  None appreciated  Skin  Inspection:  Grossly normal.  Scar on right breast from lumpectomy.   Breasts: Examined lying and sitting.     Right: Without masses, retractions, discharge or axillary adenopathy.     Left: Without masses, retractions, discharge or axillary adenopathy. Gentitourinary   Inguinal/mons:  Normal without inguinal adenopathy  External genitalia:  Normal  BUS/Urethra/Skene's glands:  Normal  Vagina:  Normal  Cervix:  Normal  Uterus:  Normal in size, shape and contour.  Midline and mobile  Adnexa/parametria:     Rt: Without masses or tenderness.   Lt: Without masses or tenderness.  Anus and perineum: Normal  Digital rectal exam: Normal sphincter tone without palpated masses or tenderness  Assessment/Plan:  35 y.o. SBF G0 for annual exam with no complaints.  Monthly cycles/condoms inconsistently Overweight Right lumpectomy  for fibroadenoma 2012  Plan:  SBE's.  Discussed decreasing calorie intake and exercising regularly for weight loss.  Okay with pregnancy, declines other contraception, MVI daily encouraged.  Diflucan 150mg  x1dose prescribed if needed.  CBC, glucose, lipid panel, UA.  2016 Pap normal with negative HR HPV , reviewed new screening guidelines.   [place], 8:45 AM 07/17/2015

## 2015-07-17 NOTE — Patient Instructions (Addendum)
Health Maintenance, Female Adopting a healthy lifestyle and getting preventive care can go a long way to promote health and wellness. Talk with your health care provider about what schedule of regular examinations is right for you. This is a good chance for you to check in with your provider about disease prevention and staying healthy. In between checkups, there are plenty of things you can do on your own. Experts have done a lot of research about which lifestyle changes and preventive measures are most likely to keep you healthy. Ask your health care provider for more information. WEIGHT AND DIET  Eat a healthy diet  Be sure to include plenty of vegetables, fruits, low-fat dairy products, and lean protein.  Do not eat a lot of foods high in solid fats, added sugars, or salt.  Get regular exercise. This is one of the most important things you can do for your health.  Most adults should exercise for at least 150 minutes each week. The exercise should increase your heart rate and make you sweat (moderate-intensity exercise).  Most adults should also do strengthening exercises at least twice a week. This is in addition to the moderate-intensity exercise.  Maintain a healthy weight  Body mass index (BMI) is a measurement that can be used to identify possible weight problems. It estimates body fat based on height and weight. Your health care provider can help determine your BMI and help you achieve or maintain a healthy weight.  For females 20 years of age and older:   A BMI below 18.5 is considered underweight.  A BMI of 18.5 to 24.9 is normal.  A BMI of 25 to 29.9 is considered overweight.  A BMI of 30 and above is considered obese.  Watch levels of cholesterol and blood lipids  You should start having your blood tested for lipids and cholesterol at 35 years of age, then have this test every 5 years.  You may need to have your cholesterol levels checked more often if:  Your lipid  or cholesterol levels are high.  You are older than 35 years of age.  You are at high risk for heart disease.  CANCER SCREENING   Lung Cancer  Lung cancer screening is recommended for adults 55-80 years old who are at high risk for lung cancer because of a history of smoking.  A yearly low-dose CT scan of the lungs is recommended for people who:  Currently smoke.  Have quit within the past 15 years.  Have at least a 30-pack-year history of smoking. A pack year is smoking an average of one pack of cigarettes a day for 1 year.  Yearly screening should continue until it has been 15 years since you quit.  Yearly screening should stop if you develop a health problem that would prevent you from having lung cancer treatment.  Breast Cancer  Practice breast self-awareness. This means understanding how your breasts normally appear and feel.  It also means doing regular breast self-exams. Let your health care provider know about any changes, no matter how small.  If you are in your 20s or 30s, you should have a clinical breast exam (CBE) by a health care provider every 1-3 years as part of a regular health exam.  If you are 40 or older, have a CBE every year. Also consider having a breast X-ray (mammogram) every year.  If you have a family history of breast cancer, talk to your health care provider about genetic screening.  If you   are at high risk for breast cancer, talk to your health care provider about having an MRI and a mammogram every year.  Breast cancer gene (BRCA) assessment is recommended for women who have family members with BRCA-related cancers. BRCA-related cancers include:  Breast.  Ovarian.  Tubal.  Peritoneal cancers.  Results of the assessment will determine the need for genetic counseling and BRCA1 and BRCA2 testing. Cervical Cancer Your health care provider may recommend that you be screened regularly for cancer of the pelvic organs (ovaries, uterus, and  vagina). This screening involves a pelvic examination, including checking for microscopic changes to the surface of your cervix (Pap test). You may be encouraged to have this screening done every 3 years, beginning at age 21.  For women ages 30-65, health care providers may recommend pelvic exams and Pap testing every 3 years, or they may recommend the Pap and pelvic exam, combined with testing for human papilloma virus (HPV), every 5 years. Some types of HPV increase your risk of cervical cancer. Testing for HPV may also be done on women of any age with unclear Pap test results.  Other health care providers may not recommend any screening for nonpregnant women who are considered low risk for pelvic cancer and who do not have symptoms. Ask your health care provider if a screening pelvic exam is right for you.  If you have had past treatment for cervical cancer or a condition that could lead to cancer, you need Pap tests and screening for cancer for at least 20 years after your treatment. If Pap tests have been discontinued, your risk factors (such as having a new sexual partner) need to be reassessed to determine if screening should resume. Some women have medical problems that increase the chance of getting cervical cancer. In these cases, your health care provider may recommend more frequent screening and Pap tests. Colorectal Cancer  This type of cancer can be detected and often prevented.  Routine colorectal cancer screening usually begins at 35 years of age and continues through 35 years of age.  Your health care provider may recommend screening at an earlier age if you have risk factors for colon cancer.  Your health care provider may also recommend using home test kits to check for hidden blood in the stool.  A small camera at the end of a tube can be used to examine your colon directly (sigmoidoscopy or colonoscopy). This is done to check for the earliest forms of colorectal  cancer.  Routine screening usually begins at age 50.  Direct examination of the colon should be repeated every 5-10 years through 35 years of age. However, you may need to be screened more often if early forms of precancerous polyps or small growths are found. Skin Cancer  Check your skin from head to toe regularly.  Tell your health care provider about any new moles or changes in moles, especially if there is a change in a mole's shape or color.  Also tell your health care provider if you have a mole that is larger than the size of a pencil eraser.  Always use sunscreen. Apply sunscreen liberally and repeatedly throughout the day.  Protect yourself by wearing long sleeves, pants, a wide-brimmed hat, and sunglasses whenever you are outside. HEART DISEASE, DIABETES, AND HIGH BLOOD PRESSURE   High blood pressure causes heart disease and increases the risk of stroke. High blood pressure is more likely to develop in:  People who have blood pressure in the high end   of the normal range (130-139/85-89 mm Hg).  People who are overweight or obese.  People who are African American.  If you are 38-23 years of age, have your blood pressure checked every 3-5 years. If you are 61 years of age or older, have your blood pressure checked every year. You should have your blood pressure measured twice--once when you are at a hospital or clinic, and once when you are not at a hospital or clinic. Record the average of the two measurements. To check your blood pressure when you are not at a hospital or clinic, you can use:  An automated blood pressure machine at a pharmacy.  A home blood pressure monitor.  If you are between 45 years and 39 years old, ask your health care provider if you should take aspirin to prevent strokes.  Have regular diabetes screenings. This involves taking a blood sample to check your fasting blood sugar level.  If you are at a normal weight and have a low risk for diabetes,  have this test once every three years after 35 years of age.  If you are overweight and have a high risk for diabetes, consider being tested at a younger age or more often. PREVENTING INFECTION  Hepatitis B  If you have a higher risk for hepatitis B, you should be screened for this virus. You are considered at high risk for hepatitis B if:  You were born in a country where hepatitis B is common. Ask your health care provider which countries are considered high risk.  Your parents were born in a high-risk country, and you have not been immunized against hepatitis B (hepatitis B vaccine).  You have HIV or AIDS.  You use needles to inject street drugs.  You live with someone who has hepatitis B.  You have had sex with someone who has hepatitis B.  You get hemodialysis treatment.  You take certain medicines for conditions, including cancer, organ transplantation, and autoimmune conditions. Hepatitis C  Blood testing is recommended for:  Everyone born from [birth year] through 1965.  Anyone with known risk factors for hepatitis C. Sexually transmitted infections (STIs)  You should be screened for sexually transmitted infections (STIs) including gonorrhea and chlamydia if:  You are sexually active and are younger than 35 years of age.  You are older than 35 years of age and your health care provider tells you that you are at risk for this type of infection.  Your sexual activity has changed since you were last screened and you are at an increased risk for chlamydia or gonorrhea. Ask your health care provider if you are at risk.  If you do not have HIV, but are at risk, it may be recommended that you take a prescription medicine daily to prevent HIV infection. This is called pre-exposure prophylaxis (PrEP). You are considered at risk if:  You are sexually active and do not regularly use condoms or know the HIV status of your partner(s).  You take drugs by injection.  You are sexually  active with a partner who has HIV. Talk with your health care provider about whether you are at high risk of being infected with HIV. If you choose to begin PrEP, you should first be tested for HIV. You should then be tested every 3 months for as long as you are taking PrEP.  PREGNANCY   If you are premenopausal and you may become pregnant, ask your health care provider about preconception counseling.  If you may  become pregnant, take 400 to 800 micrograms (mcg) of folic acid every day.  If you want to prevent pregnancy, talk to your health care provider about birth control (contraception). OSTEOPOROSIS AND MENOPAUSE   Osteoporosis is a disease in which the bones lose minerals and strength with aging. This can result in serious bone fractures. Your risk for osteoporosis can be identified using a bone density scan.  If you are 42 years of age or older, or if you are at risk for osteoporosis and fractures, ask your health care provider if you should be screened.  Ask your health care provider whether you should take a calcium or vitamin D supplement to lower your risk for osteoporosis.  Menopause may have certain physical symptoms and risks.  Hormone replacement therapy may reduce some of these symptoms and risks. Talk to your health care provider about whether hormone replacement therapy is right for you.  HOME CARE INSTRUCTIONS   Schedule regular health, dental, and eye exams.  Stay current with your immunizations.   Do not use any tobacco products including cigarettes, chewing tobacco, or electronic cigarettes.  If you are pregnant, do not drink alcohol.  If you are breastfeeding, limit how much and how often you drink alcohol.  Limit alcohol intake to no more than 1 drink per day for nonpregnant women. One drink equals 12 ounces of beer, 5 ounces of wine, or 1 ounces of hard liquor.  Do not use street drugs.  Do not share needles.  Ask your health care provider for help if  you need support or information about quitting drugs.  Tell your health care provider if you often feel depressed.  Tell your health care provider if you have ever been abused or do not feel safe at home.   This information is not intended to replace advice given to you by your health care provider. Make sure you discuss any questions you have with your health care provider.   Document Released: 07/19/2010 Document Revised: 01/24/2014 Document Reviewed: 12/05/2012 Elsevier Interactive Patient Education 2016 [place] Carbohydrate Counting for Diabetes Mellitus Carbohydrate counting is a method for keeping track of the amount of carbohydrates you eat. Eating carbohydrates naturally increases the level of sugar (glucose) in your blood, so it is important for you to know the amount that is okay for you to have in every meal. Carbohydrate counting helps keep the level of glucose in your blood within normal limits. The amount of carbohydrates allowed is different for every person. A dietitian can help you calculate the amount that is right for you. Once you know the amount of carbohydrates you can have, you can count the carbohydrates in the foods you want to eat. Carbohydrates are found in the following foods:  Grains, such as breads and cereals.  Dried beans and soy products.  Starchy vegetables, such as potatoes, peas, and corn.  Fruit and fruit juices.  Milk and yogurt.  Sweets and snack foods, such as cake, cookies, candy, chips, soft drinks, and fruit drinks. CARBOHYDRATE COUNTING There are two ways to count the carbohydrates in your food. You can use either of the methods or a combination of both. Reading the "Nutrition Facts" on [place] The "Nutrition Facts" is an area that is included on the labels of almost all packaged food and beverages in the [place]. It includes the serving size of that food or beverage and information about the nutrients in each serving of  the food, including the grams (g)  of carbohydrate per serving.  Decide the number of servings of this food or beverage that you will be able to eat or drink. Multiply that number of servings by the number of grams of carbohydrate that is listed on the label for that serving. The total will be the amount of carbohydrates you will be having when you eat or drink this food or beverage. Learning Standard Serving Sizes of Food When you eat food that is not packaged or does not include "Nutrition Facts" on the label, you need to measure the servings in order to count the amount of carbohydrates.A serving of most carbohydrate-rich foods contains about 15 g of carbohydrates. The following list includes serving sizes of carbohydrate-rich foods that provide 15 g ofcarbohydrate per serving:   1 slice of bread (1 oz) or 1 six-inch tortilla.    of a hamburger bun or English muffin.  4-6 crackers.   cup unsweetened dry cereal.    cup hot cereal.   cup rice or pasta.    cup mashed potatoes or  of a large baked potato.  1 cup fresh fruit or one small piece of fruit.    cup canned or frozen fruit or fruit juice.  1 cup milk.   cup plain fat-free yogurt or yogurt sweetened with artificial sweeteners.   cup cooked dried beans or starchy vegetable, such as peas, corn, or potatoes.  Decide the number of standard-size servings that you will eat. Multiply that number of servings by 15 (the grams of carbohydrates in that serving). For example, if you eat 2 cups of strawberries, you will have eaten 2 servings and 30 g of carbohydrates (2 servings x 15 g = 30 g). For foods such as soups and casseroles, in which more than one food is mixed in, you will need to count the carbohydrates in each food that is included. EXAMPLE OF CARBOHYDRATE COUNTING Sample Dinner  3 oz chicken breast.   cup of brown rice.   cup of corn.  1 cup milk.   1 cup strawberries with sugar-free whipped topping.   Carbohydrate Calculation Step 1: Identify the foods that contain carbohydrates:   Rice.   Corn.   Milk.   Strawberries. Step 2:Calculate the number of servings eaten of each:   2 servings of rice.   1 serving of corn.   1 serving of milk.   1 serving of strawberries. Step 3: Multiply each of those number of servings by 15 g:   2 servings of rice x 15 g = 30 g.   1 serving of corn x 15 g = 15 g.   1 serving of milk x 15 g = 15 g.   1 serving of strawberries x 15 g = 15 g. Step 4: Add together all of the amounts to find the total grams of carbohydrates eaten: 30 g + 15 g + 15 g + 15 g = 75 g.   This information is not intended to replace advice given to you by your health care provider. Make sure you discuss any questions you have with your health care provider.   Document Released: 01/03/2005 Document Revised: 01/24/2014 Document Reviewed: 11/30/2012 Elsevier Interactive Patient Education [employer].

## 2015-07-18 LAB — URINALYSIS W MICROSCOPIC + REFLEX CULTURE
BACTERIA UA: NONE SEEN [HPF]
Bilirubin Urine: NEGATIVE
CRYSTALS: NONE SEEN [HPF]
Casts: NONE SEEN [LPF]
Glucose, UA: NEGATIVE
Hgb urine dipstick: NEGATIVE
Ketones, ur: NEGATIVE
LEUKOCYTES UA: NEGATIVE
Nitrite: NEGATIVE
PROTEIN: NEGATIVE
SPECIFIC GRAVITY, URINE: 1.024 (ref 1.001–1.035)
WBC, UA: NONE SEEN WBC/HPF (ref <=5)
YEAST: NONE SEEN [HPF]
pH: 6 (ref 5.0–8.0)

## 2015-07-19 LAB — URINE CULTURE
COLONY COUNT: NO GROWTH
Organism ID, Bacteria: NO GROWTH

## 2016-01-05 ENCOUNTER — Ambulatory Visit (INDEPENDENT_AMBULATORY_CARE_PROVIDER_SITE_OTHER): Payer: 59 | Admitting: Women's Health

## 2016-01-05 ENCOUNTER — Ambulatory Visit (INDEPENDENT_AMBULATORY_CARE_PROVIDER_SITE_OTHER): Payer: 59

## 2016-01-05 ENCOUNTER — Encounter: Payer: Self-pay | Admitting: Women's Health

## 2016-01-05 VITALS — BP 126/80 | Ht 64.0 in | Wt 160.0 lb

## 2016-01-05 DIAGNOSIS — D259 Leiomyoma of uterus, unspecified: Secondary | ICD-10-CM

## 2016-01-05 DIAGNOSIS — D219 Benign neoplasm of connective and other soft tissue, unspecified: Secondary | ICD-10-CM | POA: Insufficient documentation

## 2016-01-05 DIAGNOSIS — D252 Subserosal leiomyoma of uterus: Secondary | ICD-10-CM | POA: Diagnosis not present

## 2016-01-05 DIAGNOSIS — N920 Excessive and frequent menstruation with regular cycle: Secondary | ICD-10-CM

## 2016-01-05 NOTE — Progress Notes (Signed)
Presents with complaint of irregular cycles, but upon questioning cycles are every 22-28 days heavy for the first few days lasting 4-5 days total.  No bleeding between cycles. Aunt has history of fibroids with heavy cycles, questions if she has fibroids. Same partner. Condoms for contraception. Denies vaginal discharge, urinary symptoms, abdominal pain or fever. Recently engaged planning wedding for September and hoping to conceive 6 months after.   Exam: Appears well. No CVAT. Abdomen soft nontender no rebound or radiation, external genitalia within normal limits, speculum exam moderate menses type blood noted, uterus slightly enlarged, nontender. Ultrasound: T/V anteverted uterus with posterior subserous fibroid 9 x 7 mm. Right ovary normal. Left ovary normal. Negative cul-de-sac.  Normal GYN ultrasound with small fibroid  Plan: Contraception options reviewed, will continue condoms. Folic acid or MVI daily encouraged. Reassurance given regarding normality of ultrasound.

## 2016-01-05 NOTE — Patient Instructions (Signed)
Uterine Fibroids Uterine fibroids are tissue masses (tumors). They are also called leiomyomas. They can develop inside of a woman's womb (uterus). They can grow very large. Fibroids are not cancerous (benign). Most fibroids do not require medical treatment. Follow these instructions at home:  Keep all follow-up visits as told by your doctor. This is important.  Take medicines only as told by your doctor.  If you were prescribed a hormone treatment, take the hormone medicines exactly as told.  Do not take aspirin. It can cause bleeding.  Ask your doctor about taking iron pills and increasing the amount of dark green, leafy vegetables in your diet. These actions can help to boost your blood iron levels.  Pay close attention to your period. Tell your doctor about any changes, such as:  Increased blood flow. This may require you to use more pads or tampons than usual per month.  A change in the number of days that your period lasts per month.  A change in symptoms that come with your period, such as back pain or cramping in your belly area (abdomen). Contact a doctor if:  You have pain in your back or the area between your hip bones (pelvic area) that is not controlled by medicines.  You have pain in your abdomen that is not controlled with medicines.  You have an increase in bleeding between and during periods.  You soak tampons or pads in a half hour or less.  You feel lightheaded.  You feel extra tired.  You feel weak. Get help right away if:  You pass out (faint).  You have a sudden increase in pelvic pain. This information is not intended to replace advice given to you by your health care provider. Make sure you discuss any questions you have with your health care provider. Document Released: 02/05/2010 Document Revised: 09/04/2015 Document Reviewed: 07/02/2013 Elsevier Interactive Patient Education  2017 Elsevier Inc.  

## 2016-07-04 ENCOUNTER — Telehealth: Payer: Self-pay | Admitting: *Deleted

## 2016-07-04 NOTE — Telephone Encounter (Signed)
Pt called states her LMP:06/27/16 lasted 4 days, states cycle times frame length varies, last night notes spotting while in shower, this am light spotting as well, no pain, history of small fibroid in 2017. Pt said she has been very stressed out planning wedding and life stressors as well. Pt agreed to watch for now and schedule OV if bleeding should become heavy or any pain, pt verbalized she understood.

## 2016-07-22 ENCOUNTER — Encounter: Payer: 59 | Admitting: Women's Health

## 2016-07-25 ENCOUNTER — Encounter: Payer: 59 | Admitting: Women's Health

## 2016-08-01 ENCOUNTER — Ambulatory Visit (INDEPENDENT_AMBULATORY_CARE_PROVIDER_SITE_OTHER): Payer: 59 | Admitting: Women's Health

## 2016-08-01 ENCOUNTER — Encounter: Payer: Self-pay | Admitting: Women's Health

## 2016-08-01 VITALS — BP 120/72 | Ht 65.0 in | Wt 162.0 lb

## 2016-08-01 DIAGNOSIS — Z13220 Encounter for screening for lipoid disorders: Secondary | ICD-10-CM

## 2016-08-01 DIAGNOSIS — Z01419 Encounter for gynecological examination (general) (routine) without abnormal findings: Secondary | ICD-10-CM | POA: Diagnosis not present

## 2016-08-01 LAB — LIPID PANEL
CHOL/HDL RATIO: 2.6 ratio (ref <5.0)
Cholesterol: 174 mg/dL (ref <200)
HDL: 66 mg/dL (ref >50)
LDL CALC: 92 mg/dL (ref <100)
Triglycerides: 78 mg/dL (ref <150)
VLDL: 16 mg/dL (ref <30)

## 2016-08-01 LAB — CBC WITH DIFFERENTIAL/PLATELET
BASOS ABS: 0 {cells}/uL (ref 0–200)
Basophils Relative: 0 %
EOS ABS: 75 {cells}/uL (ref 15–500)
EOS PCT: 1 %
HCT: 35.1 % (ref 35.0–45.0)
HEMOGLOBIN: 11.8 g/dL (ref 11.7–15.5)
LYMPHS ABS: 1950 {cells}/uL (ref 850–3900)
Lymphocytes Relative: 26 %
MCH: 28 pg (ref 27.0–33.0)
MCHC: 33.6 g/dL (ref 32.0–36.0)
MCV: 83.4 fL (ref 80.0–100.0)
MPV: 11.1 fL (ref 7.5–12.5)
Monocytes Absolute: 525 cells/uL (ref 200–950)
Monocytes Relative: 7 %
Neutro Abs: 4950 cells/uL (ref 1500–7800)
Neutrophils Relative %: 66 %
Platelets: 316 10*3/uL (ref 140–400)
RBC: 4.21 MIL/uL (ref 3.80–5.10)
RDW: 14.3 % (ref 11.0–15.0)
WBC: 7.5 10*3/uL (ref 3.8–10.8)

## 2016-08-01 LAB — GLUCOSE, RANDOM: Glucose, Bld: 78 mg/dL (ref 65–99)

## 2016-08-01 NOTE — Progress Notes (Signed)
Julie Snyder 1980-12-09 [id number]    History:    Presents for annual exam.  Regular monthly cycle/condoms/same partner. Marriage scheduled September. 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 Normal Pap history. Gardasil series completed. Has a small 1 cm fibroid  Past medical history, past surgical history, family history and social history were all reviewed and documented in the EPIC chart. Works at [employer] in [place], dispensing. Mother diabetes and hypertension.  ROS:  A ROS was performed and pertinent positives and negatives are included.  Exam:  Vitals:   08/01/16 1024  BP: 120/72  Weight: 162 lb (73.5 kg)  Height: 5\' 5"  (1.651 m)   Body mass index is 26.96 kg/m.   General appearance:  Normal Thyroid:  Symmetrical, normal in size, without palpable masses or nodularity. Respiratory  Auscultation:  Clear without wheezing or rhonchi Cardiovascular  Auscultation:  Regular rate, without rubs, murmurs or gallops  Edema/varicosities:  Not grossly evident Abdominal  Soft,nontender, without masses, guarding or rebound.  Liver/spleen:  No organomegaly noted  Hernia:  None appreciated  Skin  Inspection:  Grossly normal   Breasts: Examined lying and sitting.     Right: Without masses, retractions, discharge or axillary adenopathy.     Left: Without masses, retractions, discharge or axillary adenopathy. Gentitourinary   Inguinal/mons:  Normal without inguinal adenopathy  External genitalia:  Normal  BUS/Urethra/Skene's glands:  Normal  Vagina:  Normal  Cervix:  Normal  Uterus: Anteverted normal in size, shape and contour.  Midline and mobile  Adnexa/parametria:     Rt: Without masses or tenderness.   Lt: Without masses or tenderness.  Anus and perineum: Normal  Digital rectal exam: Normal sphincter tone without palpated masses or tenderness  Assessment/Plan:  36 y.o. engaged BF G0  for annual exam with no complaints.  Monthly cycle/withdrawal  Plan: Contraception  reviewed and declines. SBE's, exercise, calcium rich diet, MVI daily encouraged. CBC, glucose, lipid panel, Pap normal with negative HR HPV 2016, new screening guidelines reviewed.    [place], 10:53 AM 08/01/2016

## 2016-08-01 NOTE — Patient Instructions (Signed)

## 2016-08-02 LAB — URINALYSIS W MICROSCOPIC + REFLEX CULTURE
BACTERIA UA: NONE SEEN [HPF]
Bilirubin Urine: NEGATIVE
CRYSTALS: NONE SEEN [HPF]
Casts: NONE SEEN [LPF]
Glucose, UA: NEGATIVE
HGB URINE DIPSTICK: NEGATIVE
KETONES UR: NEGATIVE
Leukocytes, UA: NEGATIVE
Nitrite: NEGATIVE
PROTEIN: NEGATIVE
RBC / HPF: NONE SEEN RBC/HPF (ref <=2)
Specific Gravity, Urine: 1.024 (ref 1.001–1.035)
WBC UA: NONE SEEN WBC/HPF (ref <=5)
Yeast: NONE SEEN [HPF]
pH: 5.5 (ref 5.0–8.0)

## 2016-09-12 ENCOUNTER — Encounter: Payer: Self-pay | Admitting: Women's Health

## 2016-09-12 NOTE — Progress Notes (Signed)
Per Elon Alas , Health Screening form for work was mailed today 09/12/16. Pt aware

## 2016-09-30 ENCOUNTER — Other Ambulatory Visit: Payer: Self-pay | Admitting: Family Medicine

## 2016-09-30 ENCOUNTER — Ambulatory Visit
Admission: RE | Admit: 2016-09-30 | Discharge: 2016-09-30 | Disposition: A | Discharge status: Home / Self Care | Payer: 59 | Source: Ambulatory Visit | Attending: Family Medicine | Admitting: Family Medicine

## 2016-09-30 DIAGNOSIS — R52 Pain, unspecified: Secondary | ICD-10-CM

## 2016-09-30 MED ORDER — IOPAMIDOL (ISOVUE-300) INJECTION 61%
100.0000 mL | Freq: Once | INTRAVENOUS | Status: AC | PRN
  Administered 2016-09-30: 100 mL via INTRAVENOUS

## 2017-07-19 ENCOUNTER — Encounter: Payer: Self-pay | Admitting: Women's Health

## 2017-07-19 ENCOUNTER — Ambulatory Visit (INDEPENDENT_AMBULATORY_CARE_PROVIDER_SITE_OTHER): Payer: BLUE CROSS/BLUE SHIELD | Admitting: Women's Health

## 2017-07-19 DIAGNOSIS — N920 Excessive and frequent menstruation with regular cycle: Secondary | ICD-10-CM

## 2017-07-19 DIAGNOSIS — N898 Other specified noninflammatory disorders of vagina: Secondary | ICD-10-CM | POA: Diagnosis not present

## 2017-07-19 LAB — WET PREP FOR TRICH, YEAST, CLUE

## 2017-07-19 MED ORDER — MEDROXYPROGESTERONE ACETATE 10 MG PO TABS: 10.0000 mg | ORAL_TABLET | Freq: Every day | ORAL | 0 refills | Status: DC

## 2017-07-19 NOTE — Progress Notes (Signed)
History: 37 y/o MBF G0 presents with complaint of missed period with spotting. Had unprotected intercourse with spouse, subsequently took Plan B in 04/2017. History of regular monthly periods prior. Last regular menstrual cycle 5/9-13/2019. Irregular brown spotting with occasional small blood clots in 06/2017.  2 home pregnancy tests which were both negative, most recent test 07/15/17. Denies breast tenderness, nausea, vaginal itching/irritation/odor, abdomen pain or urinary symptoms. Natural family planning, desire to have kids in 2020.  Normal pap history.  Small 1 cm fibroid, asymptomatic.  Exam: Appears well. External genitalia normal. Speculum exam, no erythema, moderate dark-brown bloody discharge present, no odor. Wet prep, negative. Non tender  Dysfunctional vaginal bleeding/spotting  Plan: Discussed normality of exam findings likely cause from Plan B. Qualitative serum hCG Provera 10 mg by mouth daily for 10 days pending negative hCG results. Proper use given. Discussed decreased fertility with advancing age and natural family planning techniques. Declines alternative birth control. Will contact with hCG result and notify when to start Provera.  Schedule annual exam.

## 2017-07-19 NOTE — Patient Instructions (Signed)
Dysfunctional Uterine Bleeding °Dysfunctional uterine bleeding is abnormal bleeding from the uterus. Dysfunctional uterine bleeding includes: °· A period that comes earlier or later than usual. °· A period that is lighter, heavier, or has blood clots. °· Bleeding between periods. °· Skipping one or more periods. °· Bleeding after sexual intercourse. °· Bleeding after menopause. ° °Follow these instructions at home: °Pay attention to any changes in your symptoms. Follow these instructions to help with your condition: °Eating and drinking °· Eat well-balanced meals. Include foods that are high in iron, such as liver, meat, shellfish, green leafy vegetables, and eggs. °· If you become constipated: °? Drink plenty of water. °? Eat fruits and vegetables that are high in water and fiber, such as spinach, carrots, raspberries, apples, and mango. °Medicines °· Take over-the-counter and prescription medicines only as told by your health care provider. °· Do not change medicines without talking with your health care provider. °· Aspirin or medicines that contain aspirin may make the bleeding worse. Do not take those medicines: °? During the week before your period. °? During your period. °· If you were prescribed iron pills, take them as told by your health care provider. Iron pills help to replace iron that your body loses because of this condition. °Activity °· If you need to change your sanitary pad or tampon more than one time every 2 hours: °? Lie in bed with your feet raised (elevated). °? Place a cold pack on your lower abdomen. °? Rest as much as possible until the bleeding stops or slows down. °· Do not try to lose weight until the bleeding has stopped and your blood iron level is back to normal. °Other Instructions °· For two months, write down: °? When your period starts. °? When your period ends. °? When any abnormal bleeding occurs. °? What problems you notice. °· Keep all follow up visits as told by your health  care provider. This is important. °Contact a health care provider if: °· You get light-headed or weak. °· You have nausea and vomiting. °· You cannot eat or drink without vomiting. °· You feel dizzy or have diarrhea while you are taking medicines. °· You are taking birth control pills or hormones, and you want to change them or stop taking them. °Get help right away if: °· You develop a fever or chills. °· You need to change your sanitary pad or tampon more than one time per hour. °· Your bleeding becomes heavier, or your flow contains clots more often. °· You develop pain in your abdomen. °· You lose consciousness. °· You develop a rash. °This information is not intended to replace advice given to you by your health care provider. Make sure you discuss any questions you have with your health care provider. °Document Released: 01/01/2000 Document Revised: 06/11/2015 Document Reviewed: 03/31/2014 °[employer]vier Interactive Patient Education © 2018 [employer] ° °

## 2017-07-20 LAB — HCG, SERUM, QUALITATIVE: Preg, Serum: NEGATIVE

## 2017-08-02 ENCOUNTER — Encounter: Payer: 59 | Admitting: Women's Health

## 2017-08-03 DIAGNOSIS — R350 Frequency of micturition: Secondary | ICD-10-CM | POA: Diagnosis not present

## 2017-08-03 DIAGNOSIS — R682 Dry mouth, unspecified: Secondary | ICD-10-CM | POA: Diagnosis not present

## 2017-08-03 DIAGNOSIS — R319 Hematuria, unspecified: Secondary | ICD-10-CM | POA: Diagnosis not present

## 2017-10-02 ENCOUNTER — Encounter: Payer: Self-pay | Admitting: Women's Health

## 2017-10-02 ENCOUNTER — Ambulatory Visit (INDEPENDENT_AMBULATORY_CARE_PROVIDER_SITE_OTHER): Payer: BLUE CROSS/BLUE SHIELD | Admitting: Women's Health

## 2017-10-02 VITALS — BP 128/80 | Ht 65.0 in | Wt 165.0 lb

## 2017-10-02 DIAGNOSIS — Z01419 Encounter for gynecological examination (general) (routine) without abnormal findings: Secondary | ICD-10-CM

## 2017-10-02 NOTE — Progress Notes (Signed)
Julie Snyder 05/12/1980 [id number]    History:    Presents for annual exam.  Regular monthly cycle using no contraception desiring pregnancy.  Normal Pap history.  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  Past medical history, past surgical history, family history and social history were all reviewed and documented in the EPIC chart.  Works at [employer].  Mother diabetes and hypertension.  ROS:  A ROS was performed and pertinent positives and negatives are included.  Exam:  Vitals:   10/02/17 1135  BP: 128/80  Weight: 165 lb (74.8 kg)  Height: 5\' 5"  (1.651 m)   Body mass index is 27.46 kg/m.   General appearance:  Normal Thyroid:  Symmetrical, normal in size, without palpable masses or nodularity. Respiratory  Auscultation:  Clear without wheezing or rhonchi Cardiovascular  Auscultation:  Regular rate, without rubs, murmurs or gallops  Edema/varicosities:  Not grossly evident Abdominal  Soft,nontender, without masses, guarding or rebound.  Liver/spleen:  No organomegaly noted  Hernia:  None appreciated  Skin  Inspection:  Grossly normal   Breasts: Examined lying and sitting.     Right: Without masses, retractions, discharge or axillary adenopathy.     Left: Without masses, retractions, discharge or axillary adenopathy. Gentitourinary   Inguinal/mons:  Normal without inguinal adenopathy  External genitalia:  Normal  BUS/Urethra/Skene's glands:  Normal  Vagina:  Normal  Cervix:  Normal  Uterus:   normal in size, shape and contour.  Midline and mobile  Adnexa/parametria:     Rt: Without masses or tenderness.   Lt: Without masses or tenderness.  Anus and perineum: Normal  Digital rectal exam: Normal sphincter tone without palpated masses or tenderness  Assessment/Plan:  37 y.o. MBF G0 for annual exam with no complaints.  Monthly cycle desiring conception Labs primary care  Plan: Conception timing reviewed, increased frequency of intercourse.   Return to office with missed cycle for dating/viability ultrasound.  Aware we no longer deliver.  Prenatal vitamin daily, safe pregnancy behaviors reviewed.  SBE's, exercise, calcium rich foods encouraged.  Pap with HR HPV typing, new screening guidelines reviewed.    [place], 1:29 PM 10/02/2017

## 2017-10-02 NOTE — Patient Instructions (Signed)
Prenatal Care WHAT IS PRENATAL CARE? Prenatal care is the process of caring for a pregnant woman before she gives birth. Prenatal care makes sure that she and her baby remain as healthy as possible throughout pregnancy. Prenatal care may be provided by a midwife, family practice health care provider, or a childbirth and pregnancy specialist (obstetrician). Prenatal care may include physical examinations, testing, treatments, and education on nutrition, lifestyle, and social support services. WHY IS PRENATAL CARE SO IMPORTANT? Early and consistent prenatal care increases the chance that you and your baby will remain healthy throughout your pregnancy. This type of care also decreases a baby's risk of being born too early (prematurely), or being born smaller than expected (small for gestational age). Any underlying medical conditions you may have that could pose a risk during your pregnancy are discussed during prenatal care visits. You will also be monitored regularly for any new conditions that may arise during your pregnancy so they can be treated quickly and effectively. WHAT HAPPENS DURING PRENATAL CARE VISITS? Prenatal care visits may include the following: Discussion Tell your health care provider about any new signs or symptoms you have experienced since your last visit. These might include:  Nausea or vomiting.  Increased or decreased level of energy.  Difficulty sleeping.  Back or leg pain.  Weight changes.  Frequent urination.  Shortness of breath with physical activity.  Changes in your skin, such as the development of a rash or itchiness.  Vaginal discharge or bleeding.  Feelings of excitement or nervousness.  Changes in your baby's movements.  You may want to write down any questions or topics you want to discuss with your health care provider and bring them with you to your appointment. Examination During your first prenatal care visit, you will likely have a complete  physical exam. Your health care provider will often examine your vagina, cervix, and the position of your uterus, as well as check your heart, lungs, and other body systems. As your pregnancy progresses, your health care provider will measure the size of your uterus and your baby's position inside your uterus. He or she may also examine you for early signs of labor. Your prenatal visits may also include checking your blood pressure and, after about 10-12 weeks of pregnancy, listening to your baby's heartbeat. Testing Regular testing often includes:  Urinalysis. This checks your urine for glucose, protein, or signs of infection.  Blood count. This checks the levels of white and red blood cells in your body.  Tests for sexually transmitted infections (STIs). Testing for STIs at the beginning of pregnancy is routinely done and is required in many states.  Antibody testing. You will be checked to see if you are immune to certain illnesses, such as rubella, that can affect a developing fetus.  Glucose screen. Around 24-28 weeks of pregnancy, your blood glucose level will be checked for signs of gestational diabetes. Follow-up tests may be recommended.  Group B strep. This is a bacteria that is commonly found inside a woman's vagina. This test will inform your health care provider if you need an antibiotic to reduce the amount of this bacteria in your body prior to labor and childbirth.  Ultrasound. Many pregnant women undergo an ultrasound screening around 18-20 weeks of pregnancy to evaluate the health of the fetus and check for any developmental abnormalities.  HIV (human immunodeficiency virus) testing. Early in your pregnancy, you will be screened for HIV. If you are at high risk for HIV, this test may   be repeated during your third trimester of pregnancy.  You may be offered other testing based on your age, personal or family medical history, or other factors. HOW OFTEN SHOULD I PLAN TO SEE MY  HEALTH CARE PROVIDER FOR PRENATAL CARE? Your prenatal care check-up schedule depends on any medical conditions you have before, or develop during, your pregnancy. If you do not have any underlying medical conditions, you will likely be seen for checkups:  Monthly, during the first 6 months of pregnancy.  Twice a month during months 7 and 8 of pregnancy.  Weekly starting in the 9th month of pregnancy and until delivery.  If you develop signs of early labor or other concerning signs or symptoms, you may need to see your health care provider more often. Ask your health care provider what prenatal care schedule is best for you. WHAT CAN I DO TO KEEP MYSELF AND MY BABY AS HEALTHY AS POSSIBLE DURING MY PREGNANCY?  Take a prenatal vitamin containing 400 micrograms (0.4 mg) of folic acid every day. Your health care provider may also ask you to take additional vitamins such as iodine, vitamin D, iron, copper, and zinc.  Take 1500-2000 mg of calcium daily starting at your 20th week of pregnancy until you deliver your baby.  Make sure you are up to date on your vaccinations. Unless directed otherwise by your health care provider: ? You should receive a tetanus, diphtheria, and pertussis (Tdap) vaccination between the 27th and 36th week of your pregnancy, regardless of when your last Tdap immunization occurred. This helps protect your baby from whooping cough (pertussis) after he or she is born. ? You should receive an annual inactivated influenza vaccine (IIV) to help protect you and your baby from influenza. This can be done at any point during your pregnancy.  Eat a well-rounded diet that includes: ? Fresh fruits and vegetables. ? Lean proteins. ? Calcium-rich foods such as milk, yogurt, hard cheeses, and dark, leafy greens. ? Whole grain breads.  Do noteat seafood high in mercury, including: ? Swordfish. ? Tilefish. ? Shark. ? King mackerel. ? More than 6 oz tuna per week.  Do not  eat: ? Raw or undercooked meats or eggs. ? Unpasteurized foods, such as soft cheeses (brie, blue, or feta), juices, and milks. ? Lunch meats. ? Hot dogs that have not been heated until they are steaming.  Drink enough water to keep your urine clear or pale yellow. For many women, this may be 10 or more 8 oz glasses of water each day. Keeping yourself hydrated helps deliver nutrients to your baby and may prevent the start of pre-term uterine contractions.  Do not use any tobacco products including cigarettes, chewing tobacco, or electronic cigarettes. If you need help quitting, ask your health care provider.  Do not drink beverages containing alcohol. No safe level of alcohol consumption during pregnancy has been determined.  Do not use any illegal drugs. These can harm your developing baby or cause a miscarriage.  Ask your health care provider or pharmacist before taking any prescription or over-the-counter medicines, herbs, or supplements.  Limit your caffeine intake to no more than 200 mg per day.  Exercise. Unless told otherwise by your health care provider, try to get 30 minutes of moderate exercise most days of the week. Do not  do high-impact activities, contact sports, or activities with a high risk of falling, such as horseback riding or downhill skiing.  Get plenty of rest.  Avoid anything that raises your  body temperature, such as hot tubs and saunas.  If you own a cat, do not empty its litter box. Bacteria contained in cat feces can cause an infection called toxoplasmosis. This can result in serious harm to the fetus.  Stay away from chemicals such as insecticides, lead, mercury, and cleaning or paint products that contain solvents.  Do not have any X-rays taken unless medically necessary.  Take a childbirth and breastfeeding preparation class. Ask your health care provider if you need a referral or recommendation.  This information is not intended to replace advice given  to you by your health care provider. Make sure you discuss any questions you have with your health care provider. Document Released: 01/06/2003 Document Revised: 06/08/2015 Document Reviewed: 03/20/2013 Elsevier Interactive Patient Education  2017 [employer] Health Maintenance, Female Adopting a healthy lifestyle and getting preventive care can go a long way to promote health and wellness. Talk with your health care provider about what schedule of regular examinations is right for you. This is a good chance for you to check in with your provider about disease prevention and staying healthy. In between checkups, there are plenty of things you can do on your own. Experts have done a lot of research about which lifestyle changes and preventive measures are most likely to keep you healthy. Ask your health care provider for more information. Weight and diet Eat a healthy diet  Be sure to include plenty of vegetables, fruits, low-fat dairy products, and lean protein.  Do not eat a lot of foods high in solid fats, added sugars, or salt.  Get regular exercise. This is one of the most important things you can do for your health. ? Most adults should exercise for at least 150 minutes each week. The exercise should increase your heart rate and make you sweat (moderate-intensity exercise). ? Most adults should also do strengthening exercises at least twice a week. This is in addition to the moderate-intensity exercise.  Maintain a healthy weight  Body mass index (BMI) is a measurement that can be used to identify possible weight problems. It estimates body fat based on height and weight. Your health care provider can help determine your BMI and help you achieve or maintain a healthy weight.  For females 20 years of age and older: ? A BMI below 18.5 is considered underweight. ? A BMI of 18.5 to 24.9 is normal. ? A BMI of 25 to 29.9 is considered overweight. ? A BMI of 30 and above is considered  obese.  Watch levels of cholesterol and blood lipids  You should start having your blood tested for lipids and cholesterol at 37 years of age, then have this test every 5 years.  You may need to have your cholesterol levels checked more often if: ? Your lipid or cholesterol levels are high. ? You are older than 37 years of age. ? You are at high risk for heart disease.  Cancer screening Lung Cancer  Lung cancer screening is recommended for adults 55-80 years old who are at high risk for lung cancer because of a history of smoking.  A yearly low-dose CT scan of the lungs is recommended for people who: ? Currently smoke. ? Have quit within the past 15 years. ? Have at least a 30-pack-year history of smoking. A pack year is smoking an average of one pack of cigarettes a day for 1 year.  Yearly screening should continue until it has been 15 years since you quit.    Yearly screening should stop if you develop a health problem that would prevent you from having lung cancer treatment.  Breast Cancer  Practice breast self-awareness. This means understanding how your breasts normally appear and feel.  It also means doing regular breast self-exams. Let your health care provider know about any changes, no matter how small.  If you are in your 20s or 30s, you should have a clinical breast exam (CBE) by a health care provider every 1-3 years as part of a regular health exam.  If you are 40 or older, have a CBE every year. Also consider having a breast X-ray (mammogram) every year.  If you have a family history of breast cancer, talk to your health care provider about genetic screening.  If you are at high risk for breast cancer, talk to your health care provider about having an MRI and a mammogram every year.  Breast cancer gene (BRCA) assessment is recommended for women who have family members with BRCA-related cancers. BRCA-related cancers  include: ? Breast. ? Ovarian. ? Tubal. ? Peritoneal cancers.  Results of the assessment will determine the need for genetic counseling and BRCA1 and BRCA2 testing.  Cervical Cancer Your health care provider may recommend that you be screened regularly for cancer of the pelvic organs (ovaries, uterus, and vagina). This screening involves a pelvic examination, including checking for microscopic changes to the surface of your cervix (Pap test). You may be encouraged to have this screening done every 3 years, beginning at age 21.  For women ages 30-65, health care providers may recommend pelvic exams and Pap testing every 3 years, or they may recommend the Pap and pelvic exam, combined with testing for human papilloma virus (HPV), every 5 years. Some types of HPV increase your risk of cervical cancer. Testing for HPV may also be done on women of any age with unclear Pap test results.  Other health care providers may not recommend any screening for nonpregnant women who are considered low risk for pelvic cancer and who do not have symptoms. Ask your health care provider if a screening pelvic exam is right for you.  If you have had past treatment for cervical cancer or a condition that could lead to cancer, you need Pap tests and screening for cancer for at least 20 years after your treatment. If Pap tests have been discontinued, your risk factors (such as having a new sexual partner) need to be reassessed to determine if screening should resume. Some women have medical problems that increase the chance of getting cervical cancer. In these cases, your health care provider may recommend more frequent screening and Pap tests.  Colorectal Cancer  This type of cancer can be detected and often prevented.  Routine colorectal cancer screening usually begins at 37 years of age and continues through 37 years of age.  Your health care provider may recommend screening at an earlier age if you have risk factors  for colon cancer.  Your health care provider may also recommend using home test kits to check for hidden blood in the stool.  A small camera at the end of a tube can be used to examine your colon directly (sigmoidoscopy or colonoscopy). This is done to check for the earliest forms of colorectal cancer.  Routine screening usually begins at age 50.  Direct examination of the colon should be repeated every 5-10 years through 37 years of age. However, you may need to be screened more often if early forms of precancerous   polyps or small growths are found.  Skin Cancer  Check your skin from head to toe regularly.  Tell your health care provider about any new moles or changes in moles, especially if there is a change in a mole's shape or color.  Also tell your health care provider if you have a mole that is larger than the size of a pencil eraser.  Always use sunscreen. Apply sunscreen liberally and repeatedly throughout the day.  Protect yourself by wearing long sleeves, pants, a wide-brimmed hat, and sunglasses whenever you are outside.  Heart disease, diabetes, and high blood pressure  High blood pressure causes heart disease and increases the risk of stroke. High blood pressure is more likely to develop in: ? People who have blood pressure in the high end of the normal range (130-139/85-89 mm Hg). ? People who are overweight or obese. ? People who are African American.  If you are 18-39 years of age, have your blood pressure checked every 3-5 years. If you are 40 years of age or older, have your blood pressure checked every year. You should have your blood pressure measured twice-once when you are at a hospital or clinic, and once when you are not at a hospital or clinic. Record the average of the two measurements. To check your blood pressure when you are not at a hospital or clinic, you can use: ? An automated blood pressure machine at a pharmacy. ? A home blood pressure monitor.  If  you are between 55 years and 79 years old, ask your health care provider if you should take aspirin to prevent strokes.  Have regular diabetes screenings. This involves taking a blood sample to check your fasting blood sugar level. ? If you are at a normal weight and have a low risk for diabetes, have this test once every three years after 37 years of age. ? If you are overweight and have a high risk for diabetes, consider being tested at a younger age or more often. Preventing infection Hepatitis B  If you have a higher risk for hepatitis B, you should be screened for this virus. You are considered at high risk for hepatitis B if: ? You were born in a country where hepatitis B is common. Ask your health care provider which countries are considered high risk. ? Your parents were born in a high-risk country, and you have not been immunized against hepatitis B (hepatitis B vaccine). ? You have HIV or AIDS. ? You use needles to inject street drugs. ? You live with someone who has hepatitis B. ? You have had sex with someone who has hepatitis B. ? You get hemodialysis treatment. ? You take certain medicines for conditions, including cancer, organ transplantation, and autoimmune conditions.  Hepatitis C  Blood testing is recommended for: ? Everyone born from 1945 through 1965. ? Anyone with known risk factors for hepatitis C.  Sexually transmitted infections (STIs)  You should be screened for sexually transmitted infections (STIs) including gonorrhea and chlamydia if: ? You are sexually active and are younger than 37 years of age. ? You are older than 37 years of age and your health care provider tells you that you are at risk for this type of infection. ? Your sexual activity has changed since you were last screened and you are at an increased risk for chlamydia or gonorrhea. Ask your health care provider if you are at risk.  If you do not have HIV, but are at risk,   it may be recommended  that you take a prescription medicine daily to prevent HIV infection. This is called pre-exposure prophylaxis (PrEP). You are considered at risk if: ? You are sexually active and do not regularly use condoms or know the HIV status of your partner(s). ? You take drugs by injection. ? You are sexually active with a partner who has HIV.  Talk with your health care provider about whether you are at high risk of being infected with HIV. If you choose to begin PrEP, you should first be tested for HIV. You should then be tested every 3 months for as long as you are taking PrEP. Pregnancy  If you are premenopausal and you may become pregnant, ask your health care provider about preconception counseling.  If you may become pregnant, take 400 to 800 micrograms (mcg) of folic acid every day.  If you want to prevent pregnancy, talk to your health care provider about birth control (contraception). Osteoporosis and menopause  Osteoporosis is a disease in which the bones lose minerals and strength with aging. This can result in serious bone fractures. Your risk for osteoporosis can be identified using a bone density scan.  If you are 65 years of age or older, or if you are at risk for osteoporosis and fractures, ask your health care provider if you should be screened.  Ask your health care provider whether you should take a calcium or vitamin D supplement to lower your risk for osteoporosis.  Menopause may have certain physical symptoms and risks.  Hormone replacement therapy may reduce some of these symptoms and risks. Talk to your health care provider about whether hormone replacement therapy is right for you. Follow these instructions at home:  Schedule regular health, dental, and eye exams.  Stay current with your immunizations.  Do not use any tobacco products including cigarettes, chewing tobacco, or electronic cigarettes.  If you are pregnant, do not drink alcohol.  If you are  breastfeeding, limit how much and how often you drink alcohol.  Limit alcohol intake to no more than 1 drink per day for nonpregnant women. One drink equals 12 ounces of beer, 5 ounces of wine, or 1 ounces of hard liquor.  Do not use street drugs.  Do not share needles.  Ask your health care provider for help if you need support or information about quitting drugs.  Tell your health care provider if you often feel depressed.  Tell your health care provider if you have ever been abused or do not feel safe at home. This information is not intended to replace advice given to you by your health care provider. Make sure you discuss any questions you have with your health care provider. Document Released: 07/19/2010 Document Revised: 06/11/2015 Document Reviewed: 10/07/2014 Elsevier Interactive Patient Education  2018 [employer]  

## 2017-10-02 NOTE — Progress Notes (Signed)
lab

## 2017-10-04 LAB — PAP, TP IMAGING W/ HPV RNA, RFLX HPV TYPE 16,18/45: HPV DNA High Risk: NOT DETECTED

## 2017-12-06 DIAGNOSIS — R350 Frequency of micturition: Secondary | ICD-10-CM | POA: Diagnosis not present

## 2018-03-10 DIAGNOSIS — J029 Acute pharyngitis, unspecified: Secondary | ICD-10-CM | POA: Diagnosis not present

## 2018-12-11 ENCOUNTER — Other Ambulatory Visit: Payer: Self-pay

## 2018-12-12 ENCOUNTER — Encounter: Payer: Self-pay | Admitting: Women's Health

## 2018-12-12 ENCOUNTER — Ambulatory Visit (INDEPENDENT_AMBULATORY_CARE_PROVIDER_SITE_OTHER): Payer: BC Managed Care – PPO | Admitting: Women's Health

## 2018-12-12 VITALS — BP 120/82 | Ht 65.0 in | Wt 174.0 lb

## 2018-12-12 DIAGNOSIS — Z01419 Encounter for gynecological examination (general) (routine) without abnormal findings: Secondary | ICD-10-CM

## 2018-12-12 NOTE — Progress Notes (Signed)
Julie Snyder 1980/04/08 [id number]    History:    Presents for annual exam.  Regular monthly cycle withdraw, pregnancy okay, currently waiting to concieve due to Covid.  Normal Pap history.  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  Gardasil completed.  History of a small 2 cm fibroid.  Past medical history, past surgical history, family history and social history were all reviewed and documented in the EPIC chart.  Works for [employer].  Mother diabetes and hypertension.  ROS:  A ROS was performed and pertinent positives and negatives are included.  Exam:  Vitals:   12/12/18 0928  BP: 120/82  Weight: 174 lb (78.9 kg)  Height: 5\' 5"  (1.651 m)   Body mass index is 28.96 kg/m.   General appearance:  Normal Thyroid:  Symmetrical, normal in size, without palpable masses or nodularity. Respiratory  Auscultation:  Clear without wheezing or rhonchi Cardiovascular  Auscultation:  Regular rate, without rubs, murmurs or gallops  Edema/varicosities:  Not grossly evident Abdominal  Soft,nontender, without masses, guarding or rebound.  Liver/spleen:  No organomegaly noted  Hernia:  None appreciated  Skin  Inspection:  Grossly normal   Breasts: Examined lying and sitting.     Right: Without masses, retractions, discharge or axillary adenopathy.     Left: Without masses, retractions, discharge or axillary adenopathy. Gentitourinary   Inguinal/mons:  Normal without inguinal adenopathy  External genitalia:  Normal  BUS/Urethra/Skene's glands:  Normal  Vagina:  Normal  Cervix:  Normal  Uterus:  normal in size, shape and contour.  Midline and mobile  Adnexa/parametria:     Rt: Without masses or tenderness.   Lt: Without masses or tenderness.  Anus and perineum: Normal  Digital rectal exam: Normal sphincter tone without palpated masses or tenderness  Assessment/Plan:  38 y.o. MBF G0 for annual exam with no complaints.  Regular monthly cycle/withdrawal pregnancy  okay  Plan: Declines other contraception, aware we no longer deliver, will call with missed cycle for viability/dating ultrasound.  MVI daily encouraged.  SBEs, calcium rich foods, increase regular cardio type exercise encouraged..  CBC, glucose, Pap normal 2019, new screening guidelines reviewed.    [place], 10:00 AM 12/12/2018

## 2018-12-12 NOTE — Patient Instructions (Addendum)
Good to see and know you!!  Health Maintenance, Female Adopting a healthy lifestyle and getting preventive care are important in promoting health and wellness. Ask your health care provider about:  The right schedule for you to have regular tests and exams.  Things you can do on your own to prevent diseases and keep yourself healthy. What should I know about diet, weight, and exercise? Eat a healthy diet   Eat a diet that includes plenty of vegetables, fruits, low-fat dairy products, and lean protein.  Do not eat a lot of foods that are high in solid fats, added sugars, or sodium. Maintain a healthy weight Body mass index (BMI) is used to identify weight problems. It estimates body fat based on height and weight. Your health care provider can help determine your BMI and help you achieve or maintain a healthy weight. Get regular exercise Get regular exercise. This is one of the most important things you can do for your health. Most adults should:  Exercise for at least 150 minutes each week. The exercise should increase your heart rate and make you sweat (moderate-intensity exercise).  Do strengthening exercises at least twice a week. This is in addition to the moderate-intensity exercise.  Spend less time sitting. Even light physical activity can be beneficial. Watch cholesterol and blood lipids Have your blood tested for lipids and cholesterol at 38 years of age, then have this test every 5 years. Have your cholesterol levels checked more often if:  Your lipid or cholesterol levels are high.  You are older than 38 years of age.  You are at high risk for heart disease. What should I know about cancer screening? Depending on your health history and family history, you may need to have cancer screening at various ages. This may include screening for:  Breast cancer.  Cervical cancer.  Colorectal cancer.  Skin cancer.  Lung cancer. What should I know about heart disease,  diabetes, and high blood pressure? Blood pressure and heart disease  High blood pressure causes heart disease and increases the risk of stroke. This is more likely to develop in people who have high blood pressure readings, are of African descent, or are overweight.  Have your blood pressure checked: ? Every 3-5 years if you are 38-38 years of age. ? Every year if you are 38 years old or older. Diabetes Have regular diabetes screenings. This checks your fasting blood sugar level. Have the screening done:  Once every three years after age 38 if you are at a normal weight and have a low risk for diabetes.  More often and at a younger age if you are overweight or have a high risk for diabetes. What should I know about preventing infection? Hepatitis B If you have a higher risk for hepatitis B, you should be screened for this virus. Talk with your health care provider to find out if you are at risk for hepatitis B infection. Hepatitis C Testing is recommended for:  Everyone born from [birth year] through 1965.  Anyone with known risk factors for hepatitis C. Sexually transmitted infections (STIs)  Get screened for STIs, including gonorrhea and chlamydia, if: ? You are sexually active and are younger than 38 years of age. ? You are older than 38 years of age and your health care provider tells you that you are at risk for this type of infection. ? Your sexual activity has changed since you were last screened, and you are at increased risk for chlamydia or  gonorrhea. Ask your health care provider if you are at risk.  Ask your health care provider about whether you are at high risk for HIV. Your health care provider may recommend a prescription medicine to help prevent HIV infection. If you choose to take medicine to prevent HIV, you should first get tested for HIV. You should then be tested every 3 months for as long as you are taking the medicine. Pregnancy  If you are about to stop having your  period (premenopausal) and you may become pregnant, seek counseling before you get pregnant.  Take 400 to 800 micrograms (mcg) of folic acid every day if you become pregnant.  Ask for birth control (contraception) if you want to prevent pregnancy. Osteoporosis and menopause Osteoporosis is a disease in which the bones lose minerals and strength with aging. This can result in bone fractures. If you are 5 years old or older, or if you are at risk for osteoporosis and fractures, ask your health care provider if you should:  Be screened for bone loss.  Take a calcium or vitamin D supplement to lower your risk of fractures.  Be given hormone replacement therapy (HRT) to treat symptoms of menopause. Follow these instructions at home: Lifestyle  Do not use any products that contain nicotine or tobacco, such as cigarettes, e-cigarettes, and chewing tobacco. If you need help quitting, ask your health care provider.  Do not use street drugs.  Do not share needles.  Ask your health care provider for help if you need support or information about quitting drugs. Alcohol use  Do not drink alcohol if: ? Your health care provider tells you not to drink. ? You are pregnant, may be pregnant, or are planning to become pregnant.  If you drink alcohol: ? Limit how much you use to 0-1 drink a day. ? Limit intake if you are breastfeeding.  Be aware of how much alcohol is in your drink. In the [place] one drink equals one 12 oz bottle of beer (355 mL), one 5 oz glass of wine (148 mL), or one 1 oz glass of hard liquor (44 mL). General instructions  Schedule regular health, dental, and eye exams.  Stay current with your vaccines.  Tell your health care provider if: ? You often feel depressed. ? You have ever been abused or do not feel safe at home. Summary  Adopting a healthy lifestyle and getting preventive care are important in promoting health and wellness.  Follow your health care provider's  instructions about healthy diet, exercising, and getting tested or screened for diseases.  Follow your health care provider's instructions on monitoring your cholesterol and blood pressure. This information is not intended to replace advice given to you by your health care provider. Make sure you discuss any questions you have with your health care provider. Document Released: 07/19/2010 Document Revised: 12/27/2017 Document Reviewed: 12/27/2017 Elsevier Patient Education  2020 [employer].

## 2019-09-19 DIAGNOSIS — Z0000 Encounter for general adult medical examination without abnormal findings: Secondary | ICD-10-CM | POA: Diagnosis not present

## 2019-09-19 DIAGNOSIS — Z13220 Encounter for screening for lipoid disorders: Secondary | ICD-10-CM | POA: Diagnosis not present

## 2019-12-02 DIAGNOSIS — Z01419 Encounter for gynecological examination (general) (routine) without abnormal findings: Secondary | ICD-10-CM | POA: Diagnosis not present

## 2019-12-16 ENCOUNTER — Encounter: Payer: BC Managed Care – PPO | Admitting: Nurse Practitioner

## 2019-12-16 DIAGNOSIS — Z0289 Encounter for other administrative examinations: Secondary | ICD-10-CM

## 2020-06-10 ENCOUNTER — Other Ambulatory Visit (HOSPITAL_COMMUNITY): Payer: Self-pay | Admitting: Family Medicine

## 2020-06-10 ENCOUNTER — Other Ambulatory Visit: Payer: Self-pay | Admitting: Family Medicine

## 2020-06-10 DIAGNOSIS — R946 Abnormal results of thyroid function studies: Secondary | ICD-10-CM

## 2020-06-16 ENCOUNTER — Ambulatory Visit (HOSPITAL_BASED_OUTPATIENT_CLINIC_OR_DEPARTMENT_OTHER)
Admission: RE | Admit: 2020-06-16 | Discharge: 2020-06-16 | Disposition: A | Discharge status: Home / Self Care | Payer: BC Managed Care – PPO | Source: Ambulatory Visit | Attending: Family Medicine | Admitting: Family Medicine

## 2020-06-16 ENCOUNTER — Other Ambulatory Visit: Payer: Self-pay

## 2020-06-16 ENCOUNTER — Other Ambulatory Visit: Payer: 59

## 2020-06-16 DIAGNOSIS — R946 Abnormal results of thyroid function studies: Secondary | ICD-10-CM

## 2021-02-25 DIAGNOSIS — J029 Acute pharyngitis, unspecified: Secondary | ICD-10-CM | POA: Diagnosis not present

## 2021-03-02 ENCOUNTER — Encounter: Payer: Self-pay | Admitting: Nurse Practitioner

## 2021-03-02 ENCOUNTER — Ambulatory Visit (INDEPENDENT_AMBULATORY_CARE_PROVIDER_SITE_OTHER): Payer: BC Managed Care – PPO | Admitting: Nurse Practitioner

## 2021-03-02 ENCOUNTER — Other Ambulatory Visit: Payer: Self-pay

## 2021-03-02 VITALS — BP 130/84 | Ht 64.0 in | Wt 149.0 lb

## 2021-03-02 DIAGNOSIS — N6313 Unspecified lump in the right breast, lower outer quadrant: Secondary | ICD-10-CM

## 2021-03-02 DIAGNOSIS — N898 Other specified noninflammatory disorders of vagina: Secondary | ICD-10-CM

## 2021-03-02 DIAGNOSIS — Z01419 Encounter for gynecological examination (general) (routine) without abnormal findings: Secondary | ICD-10-CM | POA: Diagnosis not present

## 2021-03-02 LAB — WET PREP FOR TRICH, YEAST, CLUE

## 2021-03-02 NOTE — Progress Notes (Signed)
° °  Julie Snyder 1980/08/19 [id number]   History:  41 y.o. G0 presents for annual exam. Monthly cycles. Normal pap history. She was treated for vaginal yeast about a month ago at an urgent care. She feels after her most recent menses the discharge returned.   Gynecologic History Patient's last menstrual period was 02/20/2021. Period Cycle (Days): 28 Period Duration (Days): 4 Period Pattern: Regular Menstrual Flow: Moderate Dysmenorrhea: None Contraception/Family planning: none Sexually active: Yes  Health Maintenance Last Pap: 10/02/2017. Results were: Normal, 5-year repeat Last mammogram: 2012 Last colonoscopy: Not indicated Last Dexa: Not indicated  Past medical history, past surgical history, family history and social history were all reviewed and documented in the EPIC chart. Married. Works at [employer] was performed and pertinent positives and negatives are included.  Exam:  Vitals:   03/02/21 1625  BP: 130/84  Weight: 149 lb (67.6 kg)  Height: 5\' 4"  (1.626 m)   Body mass index is 25.58 kg/m.  General appearance:  Normal Thyroid:  Symmetrical, normal in size, without palpable masses or nodularity. Respiratory  Auscultation:  Clear without wheezing or rhonchi Cardiovascular  Auscultation:  Regular rate, without rubs, murmurs or gallops  Edema/varicosities:  Not grossly evident Abdominal  Soft,nontender, without masses, guarding or rebound.  Liver/spleen:  No organomegaly noted  Hernia:  None appreciated  Skin  Inspection:  Grossly normal Breasts: Examined lying and sitting.   Right: Large (approx. 4 cm x 2.5 cm), mobile mass just adjacent to nipple in lower quadrant, likely fibroadenoma. No retractions, nipple discharge or axillary adenopathy.   Left: Without masses, retractions, nipple discharge or axillary adenopathy. Genitourinary   Inguinal/mons:  Normal without inguinal adenopathy  External genitalia:  Normal appearing vulva with no  masses, tenderness, or lesions  BUS/Urethra/Skene's glands:  Normal  Vagina:  Normal appearing with normal color and discharge, no lesions  Cervix:  Normal appearing without discharge or lesions  Uterus:  Normal in size, shape and contour.  Midline and mobile, nontender  Adnexa/parametria:     Rt: Normal in size, without masses or tenderness.   Lt: Normal in size, without masses or tenderness.  Anus and perineum: Normal  Patient informed chaperone available to be present for breast and pelvic exam. Patient has requested no chaperone to be present. Patient has been advised what will be completed during breast and pelvic exam.   Wet prep negative  Assessment/Plan:  41 y.o. G0 for annual exam.   Well female exam with routine gynecological exam - Education provided on SBEs, importance of preventative screenings, current guidelines, high calcium diet, regular exercise, and multivitamin daily. Labs with PCP.   Vaginal discharge - Plan: [place], CLUE. Wet prep negative. Normal exam.   Mass of lower outer quadrant of right breast - Large (approx. 4 cm x 2.5 cm), mobile mass just adjacent to nipple in lower quadrant, likely fibroadenoma. Will send referral for diagnostic mammogram.   Screening for cervical cancer - Normal Pap history.  Will repeat at 5-year interval per guidelines.  Return in 1 year for annual.     Tamela Gammon DNP, 4:55 PM 03/02/2021

## 2021-03-03 ENCOUNTER — Telehealth: Payer: Self-pay

## 2021-03-03 ENCOUNTER — Other Ambulatory Visit: Payer: Self-pay

## 2021-03-03 DIAGNOSIS — N6311 Unspecified lump in the right breast, upper outer quadrant: Secondary | ICD-10-CM

## 2021-03-03 DIAGNOSIS — N6313 Unspecified lump in the right breast, lower outer quadrant: Secondary | ICD-10-CM

## 2021-03-03 NOTE — Telephone Encounter (Signed)
Patient scheduled for Monday 03/15/21 at 9:15 am to check in at 9:00am at [facility].   Left message for patient to call.

## 2021-03-03 NOTE — Telephone Encounter (Signed)
Julie Gammon, NP  [facility] Triage Please send referral for diagnostic mammogram of right breast. She is also due for screening mammogram.

## 2021-03-15 ENCOUNTER — Other Ambulatory Visit: Payer: Self-pay | Admitting: Nurse Practitioner

## 2021-03-15 ENCOUNTER — Ambulatory Visit
Admission: RE | Admit: 2021-03-15 | Discharge: 2021-03-15 | Disposition: A | Discharge status: Home / Self Care | Payer: BC Managed Care – PPO | Source: Ambulatory Visit | Attending: Nurse Practitioner | Admitting: Nurse Practitioner

## 2021-03-15 ENCOUNTER — Other Ambulatory Visit: Payer: Self-pay

## 2021-03-15 DIAGNOSIS — R922 Inconclusive mammogram: Secondary | ICD-10-CM | POA: Diagnosis not present

## 2021-03-15 DIAGNOSIS — N6320 Unspecified lump in the left breast, unspecified quadrant: Secondary | ICD-10-CM

## 2021-03-15 DIAGNOSIS — N6313 Unspecified lump in the right breast, lower outer quadrant: Secondary | ICD-10-CM

## 2021-03-16 DIAGNOSIS — Z0000 Encounter for general adult medical examination without abnormal findings: Secondary | ICD-10-CM | POA: Diagnosis not present

## 2021-03-16 DIAGNOSIS — E041 Nontoxic single thyroid nodule: Secondary | ICD-10-CM | POA: Diagnosis not present

## 2021-03-16 DIAGNOSIS — Z13220 Encounter for screening for lipoid disorders: Secondary | ICD-10-CM | POA: Diagnosis not present

## 2021-03-16 DIAGNOSIS — E559 Vitamin D deficiency, unspecified: Secondary | ICD-10-CM | POA: Diagnosis not present

## 2021-03-17 ENCOUNTER — Other Ambulatory Visit (HOSPITAL_BASED_OUTPATIENT_CLINIC_OR_DEPARTMENT_OTHER): Payer: Self-pay | Admitting: Family Medicine

## 2021-03-17 DIAGNOSIS — E041 Nontoxic single thyroid nodule: Secondary | ICD-10-CM

## 2021-03-18 ENCOUNTER — Telehealth (HOSPITAL_BASED_OUTPATIENT_CLINIC_OR_DEPARTMENT_OTHER): Payer: Self-pay

## 2021-05-24 ENCOUNTER — Telehealth: Payer: Self-pay

## 2021-05-24 NOTE — Telephone Encounter (Signed)
Patient advised. She said she will monitor for another month and decide. ?

## 2021-05-24 NOTE — Telephone Encounter (Signed)
Patient called in voice mail stating she needed to speak with her NP for referral to [place]. I called her back. She was here in Feb with breast issue and had diagnostic breast imaging and she said the Radiologist told her to return in 6 months but if she noticed any changes between now and then to call.  She called this morning but will need order. ? ?She feels 2 new lumps in bottom of right breast (she said it might just be one large one instead of two).  I explained that our protocol is before provider orders diagnostic breast imaging that they do breast exam first so they can be specific with the order/location, etc. ?I told her I will ask Tiffany, NP about placing order but I may call her back with recommendation to schedule OV. She was agreeable to that plan. ?

## 2021-05-24 NOTE — Telephone Encounter (Signed)
Left message to call.

## 2021-05-24 NOTE — Telephone Encounter (Signed)
Does she report that these are new findings? Her exam and imaging found 3 benign masses in the lower right breast at 8, 8:30, and 9 o'clock position.  ?

## 2021-05-24 NOTE — Telephone Encounter (Signed)
They are pretty large measuring 1 cm, 3.5 cm, and 4.7 cm so it is expected that she can feel them. They were easily palpated when I did her exam and found them initially. If she is worried they are getting larger we can send the referral. But because she is just now noticing them I would recommend she continue to monitor for any changes in size from this point on.  ?

## 2021-05-24 NOTE — Telephone Encounter (Signed)
When I called patient and left message to call I read her TW's message "Her exam and imaging found 3 benign masses in the lower right breast at 8, 8:30, and 9 o'clock position." ? ?Patient called back and said she should have read her report prior to calling because she did not realize there were 3 she thought it was just one. So she may be feeling the masses seen on imaging. However, she said previously she could not feel them at all but now she feels them with putting lotion on. She said she thinks they are getting larger. ?

## 2021-06-11 ENCOUNTER — Ambulatory Visit (HOSPITAL_BASED_OUTPATIENT_CLINIC_OR_DEPARTMENT_OTHER): Payer: BC Managed Care – PPO

## 2021-06-21 ENCOUNTER — Ambulatory Visit (HOSPITAL_BASED_OUTPATIENT_CLINIC_OR_DEPARTMENT_OTHER): Payer: BC Managed Care – PPO

## 2021-07-05 DIAGNOSIS — R197 Diarrhea, unspecified: Secondary | ICD-10-CM | POA: Diagnosis not present

## 2021-08-22 ENCOUNTER — Ambulatory Visit
Admission: RE | Admit: 2021-08-22 | Discharge: 2021-08-22 | Disposition: A | Discharge status: Home / Self Care | Payer: BC Managed Care – PPO | Source: Ambulatory Visit | Attending: Family Medicine | Admitting: Family Medicine

## 2021-08-22 VITALS — BP 120/74 | HR 73 | Temp 98.9°F | Resp 14 | Ht 64.0 in | Wt 144.0 lb

## 2021-08-22 DIAGNOSIS — R350 Frequency of micturition: Secondary | ICD-10-CM | POA: Diagnosis not present

## 2021-08-22 LAB — POCT URINALYSIS DIP (MANUAL ENTRY)
Bilirubin, UA: NEGATIVE
Blood, UA: NEGATIVE
Glucose, UA: NEGATIVE mg/dL
Ketones, POC UA: NEGATIVE mg/dL
Leukocytes, UA: NEGATIVE
Nitrite, UA: NEGATIVE
Protein Ur, POC: NEGATIVE mg/dL
Spec Grav, UA: >=1.03 — AB (ref 1.010–1.025)
Urobilinogen, UA: 0.2 E.U./dL
pH, UA: 6.5 (ref 5.0–8.0)

## 2021-08-22 NOTE — ED Provider Notes (Signed)
[facility]    CSN: [id number] Arrival date & time: 08/22/21  1425      History   Chief Complaint Chief Complaint  Patient presents with   Urinary Frequency    HPI Julie Snyder is a 41 y.o. female.   HPI  Patient has urinary frequency for couple of days.  Its only during the day.  Does not happen at night.  She has no burning.  No abdominal pain.  No blood or cloudy urine.  No history of urinary tract infections.  No vaginal discharge.  Is certain that she is not pregnant  History reviewed. No pertinent past medical history.  Patient Active Problem List   Diagnosis Date Noted   Fibroid 01/05/2016   Breast mass, right 11/22/2010    Past Surgical History:  Procedure Laterality Date   BREAST LUMPECTOMY  01/05/2011   Procedure: LUMPECTOMY;  Surgeon: Harl Bowie, MD;  Location: [facility];  Service: General;  Laterality: Right;   BREAST SURGERY  12/2010   RIGHT BREAST   MOUTH SURGERY  12-31-10   wisdom teeth lower right excised    OB History     Gravida  0   Para  0   Term  0   Preterm  0   AB  0   Living  0      SAB  0   IAB  0   Ectopic  0   Multiple  0   Live Births  0            Home Medications    Prior to Admission medications   Not on File    Family History Family History  Problem Relation Age of Onset   Hypertension Mother    Liver disease Mother    Healthy Father     Social History Social History   Tobacco Use   Smoking status: Some Days    Types: Cigars   Smokeless tobacco: Never  Vaping Use   Vaping Use: Never used  Substance Use Topics   Alcohol use: Not Currently   Drug use: No     Allergies   Latex   Review of Systems Review of Systems See HPI  Physical Exam Triage Vital Signs ED Triage Vitals  Enc Vitals Group     BP 08/22/21 1509 120/74     Pulse Rate 08/22/21 1509 73     Resp 08/22/21 1509 14     Temp 08/22/21 1509 98.9 F (37.2 C)     Temp Source 08/22/21 1509  Oral     SpO2 08/22/21 1509 100 %     Weight 08/22/21 1512 144 lb (65.3 kg)     Height 08/22/21 1512 '5\' 4"'$  (1.626 m)     Head Circumference --      Peak Flow --      Pain Score 08/22/21 1512 0     Pain Loc --      Pain Edu? --      Excl. in [place]? --    No data found.  Updated Vital Signs BP 120/74 (BP Location: Left Arm)   Pulse 73   Temp 98.9 F (37.2 C) (Oral)   Resp 14   Ht '5\' 4"'$  (1.626 m)   Wt 65.3 kg   LMP 08/03/2021 (Exact Date)   SpO2 100%   BMI 24.72 kg/m      Physical Exam Constitutional:      General: She is not in acute distress.  Appearance: She is well-developed.  HENT:     Head: Normocephalic and atraumatic.  Eyes:     Conjunctiva/sclera: Conjunctivae normal.     Pupils: Pupils are equal, round, and reactive to light.  Cardiovascular:     Rate and Rhythm: Normal rate.  Pulmonary:     Effort: Pulmonary effort is normal. No respiratory distress.  Abdominal:     General: There is no distension.     Palpations: Abdomen is soft.     Tenderness: There is no right CVA tenderness or left CVA tenderness.  Musculoskeletal:        General: Normal range of motion.     Cervical back: Normal range of motion.  Skin:    General: Skin is warm and dry.  Neurological:     Mental Status: She is alert.      UC Treatments / Results  Labs (all labs ordered are listed, but only abnormal results are displayed) Labs Reviewed  POCT URINALYSIS DIP (MANUAL ENTRY) - Abnormal; Notable for the following components:      Result Value   Spec Grav, UA >=1.030 (*)    All other components within normal limits  URINE CULTURE    EKG   Radiology No results found.  Procedures Procedures (including critical care time)  Medications Ordered in UC Medications - No data to display  Initial Impression / Assessment and Plan / UC Course  I have reviewed the triage vital signs and the nursing notes.  Pertinent labs & imaging results that were available during my care of  the patient were reviewed by me and considered in my medical decision making (see chart for details).     Explained to the patient that her urinalysis was completely normal.  We will send it for the culture on the chance there is an infection that is not showing.  I told her there are a lot of things that can cause frequency.  She will call or return for problems Final Clinical Impressions(s) / UC Diagnoses   Final diagnoses:  Urinary frequency     Discharge Instructions      Drink lots of water Check MyChart for your urine culture report If it is positive you will be called     ED Prescriptions   None    PDMP not reviewed this encounter.   Raylene Everts, MD 08/22/21 [phone]

## 2021-08-22 NOTE — ED Triage Notes (Signed)
Urinary frequency x 1 week Thought it was getting better  No meds OTC

## 2021-08-22 NOTE — Discharge Instructions (Signed)
Drink lots of water Check MyChart for your urine culture report If it is positive you will be called

## 2021-08-24 LAB — URINE CULTURE: Culture: <10000 — AB

## 2021-09-17 ENCOUNTER — Ambulatory Visit
Admission: RE | Admit: 2021-09-17 | Discharge: 2021-09-17 | Disposition: A | Discharge status: Home / Self Care | Payer: BC Managed Care – PPO | Source: Ambulatory Visit | Attending: Nurse Practitioner | Admitting: Nurse Practitioner

## 2021-09-17 ENCOUNTER — Other Ambulatory Visit: Payer: Self-pay | Admitting: Nurse Practitioner

## 2021-09-17 DIAGNOSIS — N6320 Unspecified lump in the left breast, unspecified quadrant: Secondary | ICD-10-CM

## 2021-09-17 DIAGNOSIS — N6322 Unspecified lump in the left breast, upper inner quadrant: Secondary | ICD-10-CM | POA: Diagnosis not present

## 2021-09-17 DIAGNOSIS — N6313 Unspecified lump in the right breast, lower outer quadrant: Secondary | ICD-10-CM

## 2021-09-17 DIAGNOSIS — N6321 Unspecified lump in the left breast, upper outer quadrant: Secondary | ICD-10-CM | POA: Diagnosis not present

## 2021-09-17 DIAGNOSIS — R922 Inconclusive mammogram: Secondary | ICD-10-CM | POA: Diagnosis not present

## 2021-09-17 DIAGNOSIS — N6310 Unspecified lump in the right breast, unspecified quadrant: Secondary | ICD-10-CM

## 2022-03-07 IMAGING — MG DIGITAL DIAGNOSTIC BILAT W/ TOMO W/ CAD
6 of 10 series · 6 of 30 positions shown · non-contrast
Comparison: Previous exam(s).

CLINICAL DATA: Patient presents for palpable mass within the right
breast.



[R TAN synth-2D]
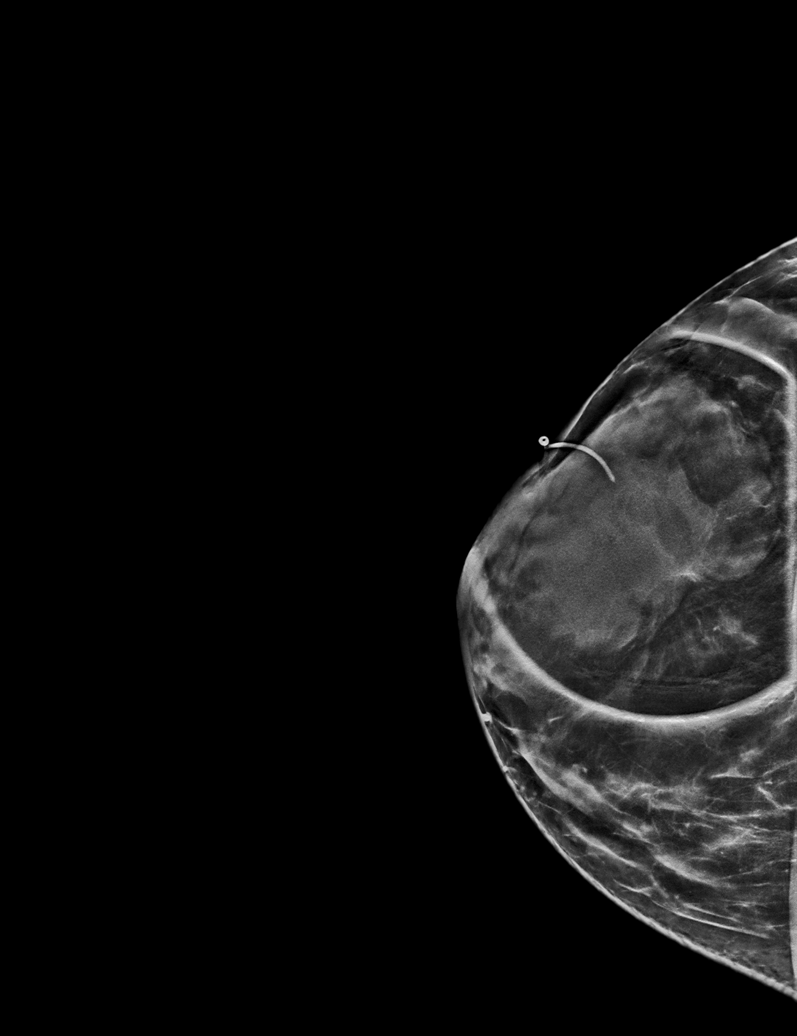

[R MLO synth-2D]
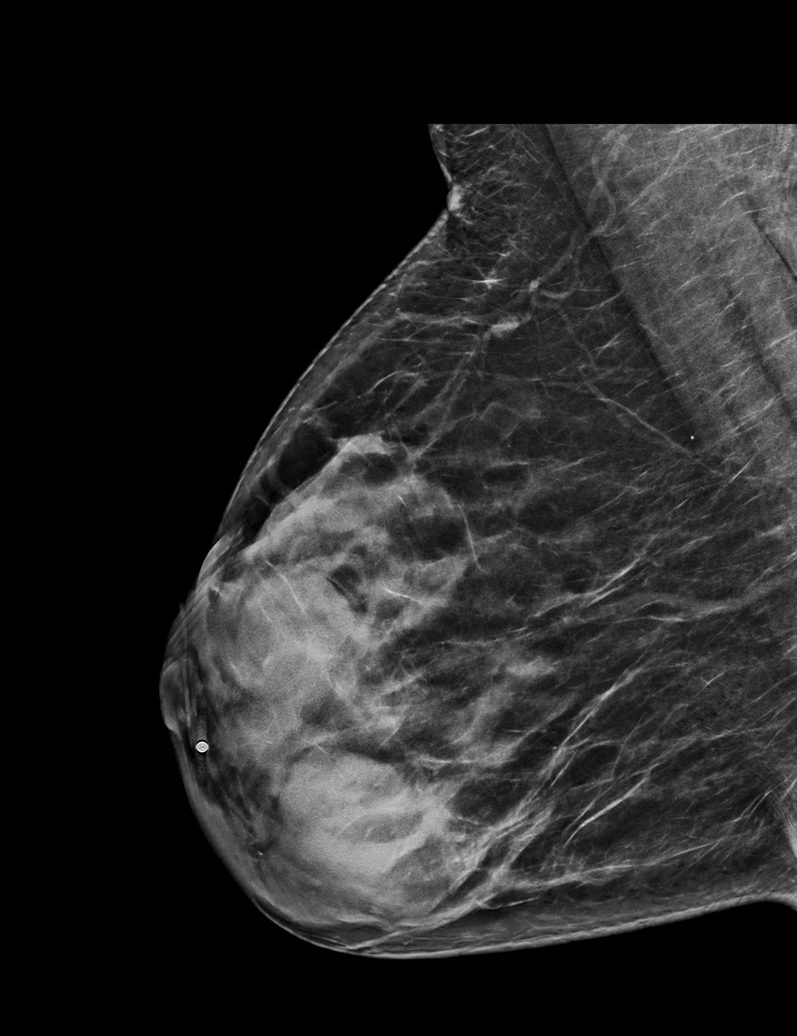

[L MLO synth-2D]
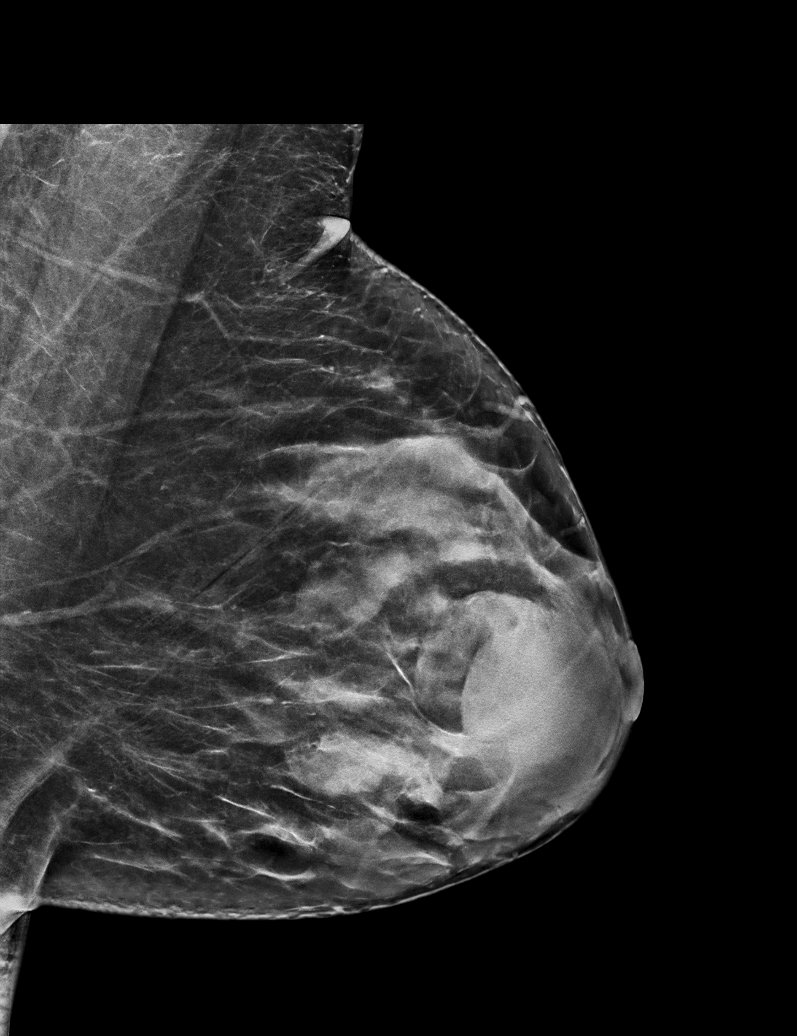

[L CC synth-2D]
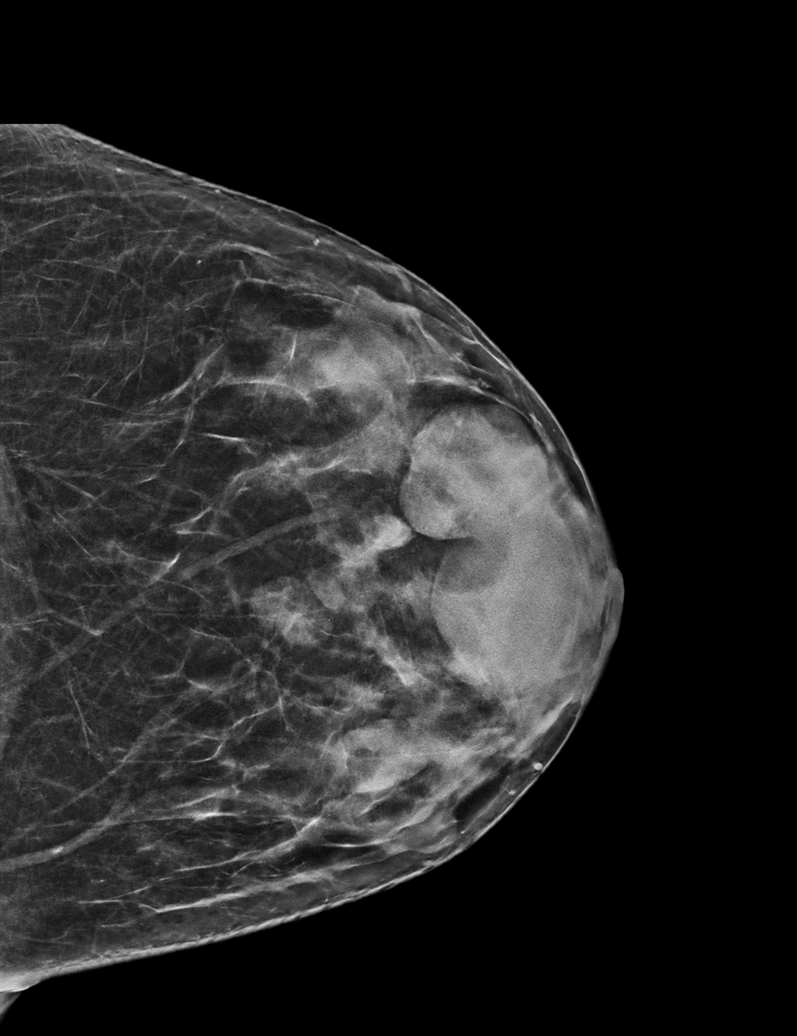

[R CC synth-2D]
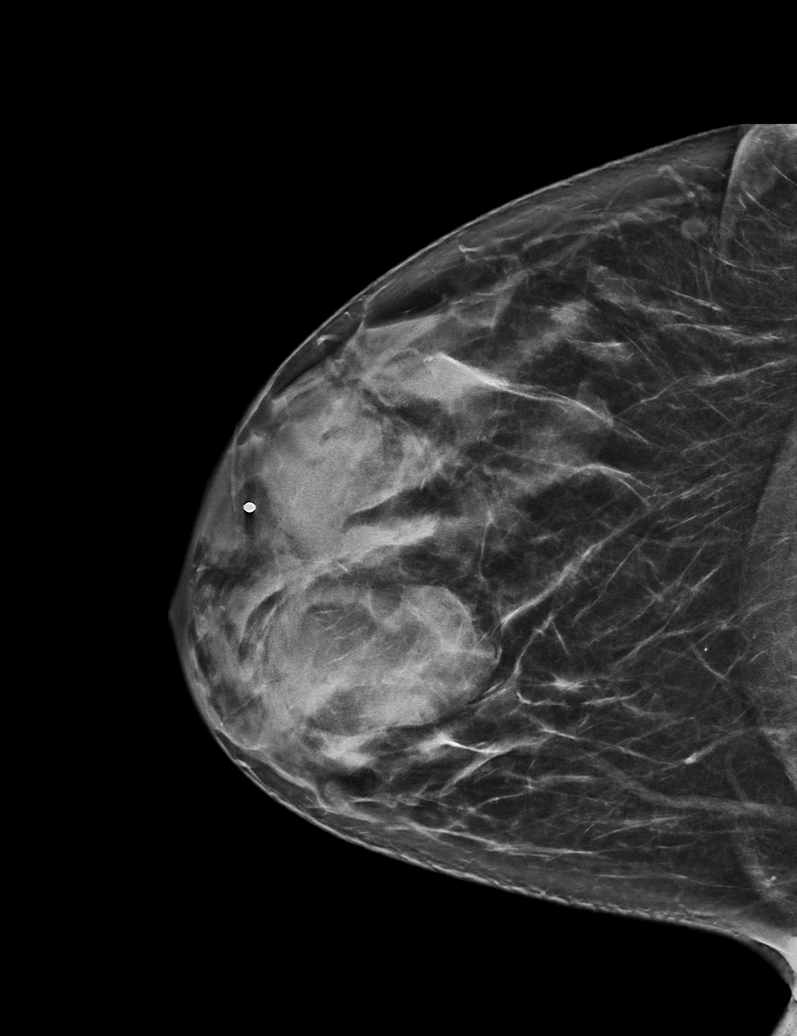

[L MLO tomo · tomo slice 34/67.0]
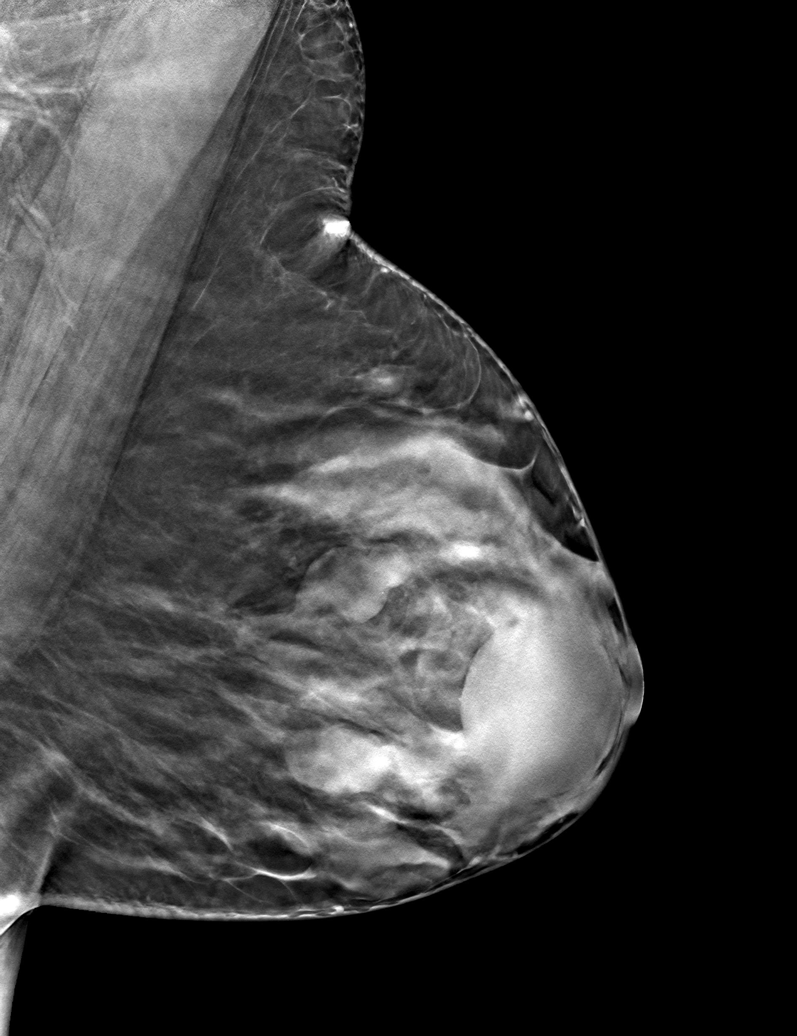

[6 of 30 positions shown; findings below may reference images not displayed]

ACR Breast Density Category c: The breast tissue is heterogeneously
dense, which may obscure small masses.
FINDINGS: Underlying the palpable marker within the anterior right breast is a
partially obscured mass. There is an additional mass within the
anterior right breast. Multiple masses are demonstrated within the
retroareolar left breast.

Targeted ultrasound is performed, showing a 4.7 x 3.3 x 2.0 cm cyst
right breast 8 o'clock position 3 cm from nipple. There is a 4.5 x
3.4 x 1.6 cm cyst right breast 9 o'clock position 3 cm from the
nipple.

Within the right breast 8:30 o'clock 2 cm from nipple there is a
x 1.1 x 1.4 cm oval circumscribed hypoechoic mass.

Within the left breast 1 o'clock position retroareolar location
there is a 3.6 x 3.7 x 2.4 cm cyst. There is an adjacent 2.8 x 3.4 x
1.6 cm cyst. Within the left breast 10 o'clock position 3 cm from
nipple there is a 2.0 x 1.3 x 1.0 cm cyst.

Within the left breast 12 o'clock position 3 cm from nipple there is
a 1.4 x 0.9 x 1.7 cm oval circumscribed hypoechoic mass.
IMPRESSION: 1. Probably benign right breast mass 8:30 o'clock.
2. Probably benign left breast mass 12 o'clock position.
3. Bilateral cysts.

RECOMMENDATION:
Bilateral diagnostic mammography and ultrasound in 6 months to
reassess probably benign bilateral breast masses.

I have discussed the findings and recommendations with the patient.
If applicable, a reminder letter will be sent to the patient
regarding the next appointment.

BI-RADS CATEGORY  3: Probably benign.

## 2022-03-21 ENCOUNTER — Ambulatory Visit
Admission: RE | Admit: 2022-03-21 | Discharge: 2022-03-21 | Disposition: A | Discharge status: Home / Self Care | Payer: BC Managed Care – PPO | Source: Ambulatory Visit | Attending: Nurse Practitioner | Admitting: Nurse Practitioner

## 2022-03-21 DIAGNOSIS — N6310 Unspecified lump in the right breast, unspecified quadrant: Secondary | ICD-10-CM

## 2022-03-21 DIAGNOSIS — N6320 Unspecified lump in the left breast, unspecified quadrant: Secondary | ICD-10-CM

## 2022-03-21 DIAGNOSIS — D242 Benign neoplasm of left breast: Secondary | ICD-10-CM | POA: Diagnosis not present

## 2022-03-21 DIAGNOSIS — D241 Benign neoplasm of right breast: Secondary | ICD-10-CM | POA: Diagnosis not present

## 2022-03-21 DIAGNOSIS — R922 Inconclusive mammogram: Secondary | ICD-10-CM | POA: Diagnosis not present

## 2022-04-14 DIAGNOSIS — R21 Rash and other nonspecific skin eruption: Secondary | ICD-10-CM | POA: Diagnosis not present

## 2022-04-18 ENCOUNTER — Ambulatory Visit: Payer: BC Managed Care – PPO | Admitting: Nurse Practitioner

## 2022-05-05 DIAGNOSIS — Z13220 Encounter for screening for lipoid disorders: Secondary | ICD-10-CM | POA: Diagnosis not present

## 2022-05-05 DIAGNOSIS — Z0001 Encounter for general adult medical examination with abnormal findings: Secondary | ICD-10-CM | POA: Diagnosis not present

## 2022-05-12 ENCOUNTER — Ambulatory Visit: Payer: BC Managed Care – PPO | Admitting: Nurse Practitioner

## 2022-05-27 DIAGNOSIS — H6123 Impacted cerumen, bilateral: Secondary | ICD-10-CM | POA: Diagnosis not present

## 2022-06-08 DIAGNOSIS — H31002 Unspecified chorioretinal scars, left eye: Secondary | ICD-10-CM | POA: Diagnosis not present

## 2022-06-22 ENCOUNTER — Encounter: Payer: Self-pay | Admitting: Nurse Practitioner

## 2022-06-22 ENCOUNTER — Telehealth: Payer: Self-pay | Admitting: *Deleted

## 2022-06-22 ENCOUNTER — Ambulatory Visit (INDEPENDENT_AMBULATORY_CARE_PROVIDER_SITE_OTHER): Payer: BC Managed Care – PPO | Admitting: Nurse Practitioner

## 2022-06-22 ENCOUNTER — Other Ambulatory Visit (HOSPITAL_COMMUNITY)
Admission: RE | Admit: 2022-06-22 | Discharge: 2022-06-22 | Disposition: A | Discharge status: Home / Self Care | Payer: BC Managed Care – PPO | Source: Ambulatory Visit | Attending: Nurse Practitioner | Admitting: Nurse Practitioner

## 2022-06-22 VITALS — BP 124/70 | HR 69 | Ht 64.0 in | Wt 150.0 lb

## 2022-06-22 DIAGNOSIS — Z124 Encounter for screening for malignant neoplasm of cervix: Secondary | ICD-10-CM

## 2022-06-22 DIAGNOSIS — Z01419 Encounter for gynecological examination (general) (routine) without abnormal findings: Secondary | ICD-10-CM

## 2022-06-22 DIAGNOSIS — N6325 Unspecified lump in the left breast, overlapping quadrants: Secondary | ICD-10-CM | POA: Diagnosis not present

## 2022-06-22 NOTE — Progress Notes (Signed)
   Julie Snyder 1980/02/24 [id number]   History:  42 y.o. G0 presents for annual exam. Monthly cycles. Normal pap history.  Stable fibroadenomas of both breast, left has decreased in size.   Gynecologic History Patient's last menstrual period was 06/16/2022. Period Cycle (Days): 28 Period Duration (Days): 3-4 Period Pattern: Regular Menstrual Flow: Moderate Menstrual Control: Maxi pad Menstrual Control Change Freq (Hours): 5-6 Dysmenorrhea: None Contraception/Family planning: none Sexually active: Yes  Health Maintenance Last Pap: 10/02/2017. Results were: Normal neg HPV, 5-year repeat Last mammogram: 03/21/2022. Results were: Interval decrease in size of left, stable right fibroadenoma Last colonoscopy: Not indicated Last Dexa: Not indicated  Past medical history, past surgical history, family history and social history were all reviewed and documented in the EPIC chart. Married. Works at [employer].   ROS:  A ROS was performed and pertinent positives and negatives are included.  Exam:  Vitals:   06/22/22 1509  BP: 124/70  Pulse: 69  SpO2: 98%  Weight: 150 lb (68 kg)  Height: 5\' 4"  (1.626 m)    Body mass index is 25.75 kg/m.  General appearance:  Normal Thyroid:  Symmetrical, normal in size, without palpable masses or nodularity. Respiratory  Auscultation:  Clear without wheezing or rhonchi Cardiovascular  Auscultation:  Regular rate, without rubs, murmurs or gallops  Edema/varicosities:  Not grossly evident Abdominal  Soft,nontender, without masses, guarding or rebound.  Liver/spleen:  No organomegaly noted  Hernia:  None appreciated  Skin  Inspection:  Grossly normal Breasts: Examined lying and sitting.   Right: Mobile mass @ 8:30 position, approx 1.5 cm x 1.5 cm (stable on imaging, benign fibroadenoma).  No retractions, nipple discharge or axillary adenopathy.  Left: Mobile mass @ 12 o'clock position, approx 1 cm x 1 cm (decreased in size on imaging, benign  fibroadenoma). Mass felt at 9 o'clock position about 1 cm from nipple, non-mobile, smooth edges. Not seen on imaging in March.  Genitourinary   Inguinal/mons:  Normal without inguinal adenopathy  External genitalia:  Normal appearing vulva with no masses, tenderness, or lesions  BUS/Urethra/Skene's glands:  Normal  Vagina:  Normal appearing with normal color and discharge, no lesions  Cervix:  Normal appearing without discharge or lesions  Uterus:  Normal in size, shape and contour.  Midline and mobile, nontender  Adnexa/parametria:     Rt: Normal in size, without masses or tenderness.   Lt: Normal in size, without masses or tenderness.  Anus and perineum: Normal  Patient informed chaperone available to be present for breast and pelvic exam. Patient has requested no chaperone to be present. Patient has been advised what will be completed during breast and pelvic exam.   Assessment/Plan:  42 y.o. G0 for annual exam.   Well female exam with routine gynecological exam - Education provided on SBEs, importance of preventative screenings, current guidelines, high calcium diet, regular exercise, and multivitamin daily. Labs with PCP.   Screening for cervical cancer - Plan: Cytology - PAP( [place]). Normal pap history.   Breast lump on left side at 9 o'clock position - Mass felt at 8:30 position about 1 cm from nipple, non-mobile, smooth edges. Not seen on imaging in March. Will send referral for diagnostic imaging.   Return in 1 year for annual.     Olivia Mackie DNP, 3:34 PM 06/22/2022

## 2022-06-22 NOTE — Telephone Encounter (Signed)
-----   Message from Olivia Mackie, NP sent at 06/22/2022  3:32 PM EDT ----- Regarding: Diagnostic breast imaging Please send referral for diagnostic imaging of left breast for mass at 8:30 position.

## 2022-06-23 NOTE — Telephone Encounter (Signed)
Pt scheduled for 07/04/2022 @ 10am.   LDVM on machine w/ appt date/time per DPR.   Will route to provider and close encounter.

## 2022-06-27 LAB — CYTOLOGY - PAP
Comment: NEGATIVE
Diagnosis: NEGATIVE
High risk HPV: NEGATIVE

## 2022-07-04 ENCOUNTER — Ambulatory Visit
Admission: RE | Admit: 2022-07-04 | Discharge: 2022-07-04 | Disposition: A | Discharge status: Home / Self Care | Payer: BC Managed Care – PPO | Source: Ambulatory Visit | Attending: Nurse Practitioner | Admitting: Nurse Practitioner

## 2022-07-04 DIAGNOSIS — N6325 Unspecified lump in the left breast, overlapping quadrants: Secondary | ICD-10-CM

## 2022-07-04 DIAGNOSIS — N6002 Solitary cyst of left breast: Secondary | ICD-10-CM | POA: Diagnosis not present

## 2022-09-22 DIAGNOSIS — N898 Other specified noninflammatory disorders of vagina: Secondary | ICD-10-CM | POA: Diagnosis not present

## 2022-10-13 DIAGNOSIS — N898 Other specified noninflammatory disorders of vagina: Secondary | ICD-10-CM | POA: Diagnosis not present

## 2022-11-07 DIAGNOSIS — N898 Other specified noninflammatory disorders of vagina: Secondary | ICD-10-CM | POA: Diagnosis not present

## 2023-06-26 ENCOUNTER — Other Ambulatory Visit: Payer: Self-pay | Admitting: Nurse Practitioner

## 2023-06-26 DIAGNOSIS — N6310 Unspecified lump in the right breast, unspecified quadrant: Secondary | ICD-10-CM

## 2023-08-09 ENCOUNTER — Ambulatory Visit
Admission: RE | Admit: 2023-08-09 | Discharge: 2023-08-09 | Disposition: A | Discharge status: Home / Self Care | Source: Ambulatory Visit | Attending: Nurse Practitioner

## 2023-08-09 DIAGNOSIS — N6310 Unspecified lump in the right breast, unspecified quadrant: Secondary | ICD-10-CM

## 2023-08-11 ENCOUNTER — Encounter: Payer: Self-pay | Admitting: Nurse Practitioner

## 2023-08-11 ENCOUNTER — Ambulatory Visit: Admitting: Nurse Practitioner

## 2023-08-11 VITALS — BP 122/80 | HR 64 | Ht 65.0 in | Wt 148.0 lb

## 2023-08-11 DIAGNOSIS — N898 Other specified noninflammatory disorders of vagina: Secondary | ICD-10-CM

## 2023-08-11 DIAGNOSIS — Z01419 Encounter for gynecological examination (general) (routine) without abnormal findings: Secondary | ICD-10-CM

## 2023-08-11 DIAGNOSIS — N6324 Unspecified lump in the left breast, lower inner quadrant: Secondary | ICD-10-CM

## 2023-08-11 DIAGNOSIS — Z1331 Encounter for screening for depression: Secondary | ICD-10-CM | POA: Diagnosis not present

## 2023-08-11 DIAGNOSIS — B3731 Acute candidiasis of vulva and vagina: Secondary | ICD-10-CM

## 2023-08-11 DIAGNOSIS — N6310 Unspecified lump in the right breast, unspecified quadrant: Secondary | ICD-10-CM

## 2023-08-11 LAB — WET PREP FOR TRICH, YEAST, CLUE

## 2023-08-11 MED ORDER — FLUCONAZOLE 150 MG PO TABS
150.0000 mg | ORAL_TABLET | ORAL | 0 refills | Status: DC
Start: 2023-08-11 — End: 2023-10-04

## 2023-08-11 NOTE — Patient Instructions (Signed)

## 2023-08-11 NOTE — Progress Notes (Signed)
 Julie Snyder 1980-12-14 [id number]   History:  43 y.o. G0 presents for annual exam. Monthly cycles. Normal pap history. Stable fibroadenomas of both breasts, left has decreased in size. Treated for BV 6/9 and 7/12. Unsure if she still has symptoms. Recently changed soaps and thinks this was the cause. Went back to previous soap.   Gynecologic History Patient's last menstrual period was 07/23/2023 (approximate). Period Cycle (Days): 28 Period Duration (Days): 5 Period Pattern: Regular Menstrual Flow: Moderate Menstrual Control: Maxi pad Menstrual Control Change Freq (Hours): 2 Dysmenorrhea: None Contraception/Family planning: none Sexually active: Yes  Health Maintenance Last Pap: 06/22/2022. Results were: Normal neg HPV Last mammogram: 08/09/2023. Results were: Bilateral breast masses, left cyst decrease in size Last colonoscopy: Not indicated Last Dexa: Not indicated     08/11/2023   11:56 AM  Depression screen PHQ 2/9  Decreased Interest 0  Down, Depressed, Hopeless 0  PHQ - 2 Score 0     Past medical history, past surgical history, family history and social history were all reviewed and documented in the EPIC chart. Married. Works at [employer].   ROS:  A ROS was performed and pertinent positives and negatives are included.  Exam:  Vitals:   08/11/23 1156  BP: 122/80  Pulse: 64  Weight: 148 lb (67.1 kg)  Height: 5' 5 (1.651 m)    Body mass index is 24.63 kg/m.  General appearance:  Normal Thyroid :  Symmetrical, normal in size, without palpable masses or nodularity. Respiratory  Auscultation:  Clear without wheezing or rhonchi Cardiovascular  Auscultation:  Regular rate, without rubs, murmurs or gallops  Edema/varicosities:  Not grossly evident Abdominal  Soft,nontender, without masses, guarding or rebound.  Liver/spleen:  No organomegaly noted  Hernia:  None appreciated  Skin  Inspection:  Grossly normal Breasts: Examined lying and  sitting.   Right: Mobile mass @ 8:30 position, approx 1 cm x 1 cm (stable on imaging, benign fibroadenoma).  No retractions, nipple discharge or axillary adenopathy.  Left: Mobile mass @ 12 o'clock position, approx 1 cm x 1 cm (decreased in size on imaging, benign fibroadenoma). Pelvic: External genitalia:  no lesions              Urethra:  normal appearing urethra with no masses, tenderness or lesions              Bartholins and Skenes: normal                 Vagina: Discharge present, no erythema              Cervix: no lesions Bimanual Exam:  Uterus:  no masses or tenderness              Adnexa: no mass, fullness, tenderness              Rectovaginal: Deferred              Anus:  normal, no lesions  Dereck Keas, CMA present as chaperone.   Wet prep + yeast  Assessment/Plan:  43 y.o. G0 for annual exam.   Well female exam with routine gynecological exam - Education provided on SBEs, importance of preventative screenings, current guidelines, high calcium diet, regular exercise, and multivitamin daily. Labs with PCP.   Mass of lower inner quadrant of left breast - being followed. Back to annual screenings. Decrease in size.   Mass of right breast, unspecified quadrant - being followed.   Vaginal discharge - Plan: WET PREP FOR TRICH,  YEAST, CLUE. + yeast  Vaginal candidiasis - Plan: fluconazole  (DIFLUCAN ) 150 MG tablet every 3 days x 2 doses.   Screening for cervical cancer - Normal Pap history.  Will repeat at 5-year interval per guidelines.  Return in about 1 year (around 08/10/2024) for Annual.     Julie DELENA Shutter DNP, 12:23 PM 08/11/2023

## 2023-10-04 ENCOUNTER — Encounter: Payer: Self-pay | Admitting: Nurse Practitioner

## 2023-10-04 ENCOUNTER — Ambulatory Visit (INDEPENDENT_AMBULATORY_CARE_PROVIDER_SITE_OTHER): Admitting: Nurse Practitioner

## 2023-10-04 VITALS — BP 114/74 | HR 72 | Resp 16

## 2023-10-04 DIAGNOSIS — N898 Other specified noninflammatory disorders of vagina: Secondary | ICD-10-CM

## 2023-10-04 DIAGNOSIS — B3731 Acute candidiasis of vulva and vagina: Secondary | ICD-10-CM

## 2023-10-04 MED ORDER — FLUCONAZOLE 150 MG PO TABS: 150.0000 mg | ORAL_TABLET | ORAL | 0 refills | Status: DC

## 2023-10-04 NOTE — Progress Notes (Signed)
   Acute Office Visit  Subjective:    Patient ID: Julie Snyder, female    DOB: March 05, 1980, 43 y.o.   MRN: [id number]   HPI 43 y.o. presents today for clear vaginal discharge. Denies odor or itching. Treated for BV 09/13/23 at [facility] after + wet prep. Neg yeast and trich at that time. Symptoms started after going to the pool. Feels this has been an issue the last few years with swimming. Treated with PO Flagyl , also provided Diflucan . Symptoms did resolve and then returned a few days later. Treated for yeast 7/25. Unsure if vaginitis is related to menses or intercourse.   Patient's last menstrual period was 09/24/2023 (exact date). Period Duration (Days): 5 Period Pattern: Regular Menstrual Flow: Moderate Menstrual Control: Maxi pad Dysmenorrhea: None  Review of Systems  Constitutional: Negative.   Genitourinary:  Positive for vaginal discharge. Negative for vaginal pain.       Objective:    Physical Exam Constitutional:      Appearance: Normal appearance.  Genitourinary:    General: Normal vulva.     Vagina: Vaginal discharge (Creamy, white) present.     Comments: Unable to tolerate speculum exam    BP 114/74   Pulse 72   Resp 16   LMP 09/24/2023 (Exact Date)  Wt Readings from Last 3 Encounters:  08/11/23 148 lb (67.1 kg)  06/22/22 150 lb (68 kg)  08/22/21 144 lb (65.3 kg)        Julie Snyder, CMA present as Biomedical engineer.   Wet prep + yeast  Assessment & Plan:   Problem List Items Addressed This Visit   None Visit Diagnoses       Vaginal candidiasis    -  Primary   Relevant Medications   fluconazole  (DIFLUCAN ) 150 MG tablet     Vaginal discharge       Relevant Orders   WET PREP FOR TRICH, YEAST, CLUE      Plan: Wet prep positive for yeast. Diflucan  150 mg every 3 days. Recommend women's probiotic, boric acid suppositories as needed, especially with swimming.   Return if symptoms worsen or fail to improve.    Julie DELENA Shutter DNP, 9:20 AM  10/04/2023

## 2023-10-05 ENCOUNTER — Other Ambulatory Visit: Payer: Self-pay | Admitting: Nurse Practitioner

## 2023-10-05 LAB — WET PREP FOR TRICH, YEAST, CLUE

## 2023-10-05 LAB — TIQ-MISC

## 2023-10-05 NOTE — Addendum Note (Signed)
 Addended byBETHA PRENTISS RIGGS on: 10/05/2023 09:40 AM   Modules accepted: Orders

## 2023-10-12 ENCOUNTER — Telehealth: Payer: Self-pay | Admitting: *Deleted

## 2023-10-12 ENCOUNTER — Other Ambulatory Visit: Payer: Self-pay | Admitting: Nurse Practitioner

## 2023-10-12 DIAGNOSIS — B3731 Acute candidiasis of vulva and vagina: Secondary | ICD-10-CM

## 2023-10-12 MED ORDER — TERCONAZOLE 0.4 % VA CREA: 1.0000 | TOPICAL_CREAM | Freq: Every day | VAGINAL | 0 refills | Status: DC

## 2023-10-12 NOTE — Telephone Encounter (Signed)
 Patient notified.   Request [facility] on [facility].

## 2023-10-12 NOTE — Telephone Encounter (Signed)
 Patient was seen in office on 10/04/23, treated for yeast with Diflucan  q3 days x3. Took 3rd dose on Tuesday. Patient reports continued vaginal discharge, yellow in color, no odor. Denies itching. Some tenderness and discomfort when voiding. Has started probiotic as recommended. Asking for additional recommendations.   Routing to Tiffany to advise

## 2023-10-12 NOTE — Telephone Encounter (Signed)
 Sent. Thanks.

## 2023-10-12 NOTE — Telephone Encounter (Signed)
 If she feels yeast is still present I recommend doing a cream. If she is agreeable, I will send this in.

## 2024-01-25 ENCOUNTER — Encounter: Payer: Self-pay | Admitting: Nurse Practitioner

## 2024-01-25 ENCOUNTER — Ambulatory Visit (INDEPENDENT_AMBULATORY_CARE_PROVIDER_SITE_OTHER): Admitting: Nurse Practitioner

## 2024-01-25 VITALS — BP 122/76 | HR 75

## 2024-01-25 DIAGNOSIS — B9689 Other specified bacterial agents as the cause of diseases classified elsewhere: Secondary | ICD-10-CM

## 2024-01-25 DIAGNOSIS — N760 Acute vaginitis: Secondary | ICD-10-CM

## 2024-01-25 DIAGNOSIS — Z113 Encounter for screening for infections with a predominantly sexual mode of transmission: Secondary | ICD-10-CM

## 2024-01-25 DIAGNOSIS — N9089 Other specified noninflammatory disorders of vulva and perineum: Secondary | ICD-10-CM

## 2024-01-25 LAB — WET PREP FOR TRICH, YEAST, CLUE

## 2024-01-25 MED ORDER — METRONIDAZOLE 500 MG PO TABS: 500.0000 mg | ORAL_TABLET | Freq: Two times a day (BID) | ORAL | 0 refills | Status: DC

## 2024-01-25 NOTE — Progress Notes (Signed)
" ° °  Acute Office Visit  Subjective:    Patient ID: Julie Snyder, female    DOB: 08-21-1980, 44 y.o.   MRN: [id number]   HPI 44 y.o. presents today for discharge and irritation. Notices amount of discharge fluctuates throughout her cycle the last 3-4 months. Intermittent mild external itching, always resolves. No odor. Would like STD screening.   Patient's last menstrual period was 01/09/2024 (exact date). Period Cycle (Days): 28 Period Duration (Days): 4-5 Period Pattern: Regular Menstrual Flow: Moderate Menstrual Control: Maxi pad Dysmenorrhea: (!) Mild (Sometimes) Dysmenorrhea Symptoms: Cramping  Review of Systems  Constitutional: Negative.   Genitourinary:  Positive for vaginal discharge and vaginal pain (Vulvar irritation).       Objective:    Physical Exam Exam conducted with a chaperone present.  Constitutional:      Appearance: Normal appearance.  Genitourinary:    General: Normal vulva.     Vagina: Vaginal discharge present. No erythema.     Cervix: Normal.     BP 122/76 (BP Location: Left Arm, Patient Position: Sitting)   Pulse 75   LMP 01/09/2024 (Exact Date)   SpO2 94%  Wt Readings from Last 3 Encounters:  08/11/23 148 lb (67.1 kg)  06/22/22 150 lb (68 kg)  08/22/21 144 lb (65.3 kg)        Julie Snyder, CMA present as chaperone.   Wet prep + clue cells (+ odor)  Assessment & Plan:   Problem List Items Addressed This Visit   None Visit Diagnoses       Bacterial vaginosis    -  Primary   Relevant Medications   metroNIDAZOLE  (FLAGYL ) 500 MG tablet     Vulvar irritation       Relevant Orders   WET PREP FOR TRICH, YEAST, CLUE     Screening examination for STD (sexually transmitted disease)       Relevant Orders   C. trachomatis/N. gonorrhoeae RNA      Plan: Flagyl  500 mg BID x 7 days. GC/CT pending.   Return if symptoms worsen or fail to improve.    Julie DELENA Shutter DNP, 4:21 PM 01/25/2024 "

## 2024-01-26 ENCOUNTER — Other Ambulatory Visit: Payer: Self-pay | Admitting: Nurse Practitioner

## 2024-01-26 ENCOUNTER — Telehealth: Payer: Self-pay

## 2024-01-26 DIAGNOSIS — B9689 Other specified bacterial agents as the cause of diseases classified elsewhere: Secondary | ICD-10-CM

## 2024-01-26 DIAGNOSIS — N760 Acute vaginitis: Secondary | ICD-10-CM

## 2024-01-26 LAB — C. TRACHOMATIS/N. GONORRHOEAE RNA
C. trachomatis RNA, TMA: NOT DETECTED
N. gonorrhoeae RNA, TMA: NOT DETECTED

## 2024-01-26 MED ORDER — FLUCONAZOLE 150 MG PO TABS: 150.0000 mg | ORAL_TABLET | ORAL | 0 refills | Status: DC

## 2024-01-26 MED ORDER — METRONIDAZOLE 0.75 % VA GEL: 1.0000 | Freq: Every day | VAGINAL | 0 refills | Status: AC

## 2024-01-26 NOTE — Telephone Encounter (Signed)
 Patient is requesting metro gel. Well call pharmacy [facility] [place] to cancel flagyl .

## 2024-01-26 NOTE — Telephone Encounter (Signed)
 Patient called and requested Vaginal medication for her yeast infection she also requested Diflucan  with her antibiotic. Please advise.

## 2024-01-26 NOTE — Telephone Encounter (Signed)
 "  Diflucan  sent to pharmacy.   "

## 2024-01-26 NOTE — Telephone Encounter (Signed)
 Metrogel  sent. May need to call pharmacy to cancel Flagyl .

## 2024-01-29 ENCOUNTER — Ambulatory Visit: Payer: Self-pay | Admitting: Nurse Practitioner

## 2024-02-08 ENCOUNTER — Telehealth: Payer: Self-pay | Admitting: *Deleted

## 2024-02-08 NOTE — Telephone Encounter (Signed)
 Left message to call back

## 2024-02-08 NOTE — Telephone Encounter (Signed)
 Spoke with patient, advised as seen below per Jami. Patient verbalizes understanding and is agreeable.    Encounter closed.

## 2024-02-08 NOTE — Telephone Encounter (Signed)
 I recommend taking the diflucan 

## 2024-02-08 NOTE — Telephone Encounter (Signed)
 Spoke with patient. Patient completed metrogel  on Monday for BV. Patient reports yellow vaginal discharge with mucous still present. Reports medicinal odor to discharge. External itching last night. Some tenderness when voiding.   Patient was also prescribed diflucan  at her request when metrogel  sent, patient has not taken diflucan , was not aware it was sent to pharmacy.   Patient asking if she needs additional treatment?  Advised I will review with covering provider and f/u with recommendations. Patient agreeable.   Routing to Waller to review and advise.

## 2024-02-09 ENCOUNTER — Telehealth: Payer: Self-pay

## 2024-02-09 DIAGNOSIS — N760 Acute vaginitis: Secondary | ICD-10-CM

## 2024-02-09 MED ORDER — FLUCONAZOLE 150 MG PO TABS: 150.0000 mg | ORAL_TABLET | ORAL | 0 refills | Status: AC

## 2024-02-09 NOTE — Telephone Encounter (Signed)
 Patient called and stated that the pharmacy told her that they never got the diflucan  rx that we sent on 01-26-24. Patient was told initially she did not need to take the rx. Once she developed symptoms, Jami told her to take it on 02-08-24. I sent rx for #2 tablets with 0 refills to pts requested pharmacy [facility] [place] instead due to them having it in stock. Patient is aware.
